# Patient Record
Sex: Male | Born: 1970 | Race: White | Hispanic: No | Marital: Married | Smoking: Former smoker
Health system: Southern US, Community
[De-identification: ages and names within clinical notes are randomized; demographics above are authoritative.]

## PROBLEM LIST (undated history)

## (undated) DIAGNOSIS — U071 COVID-19: Secondary | ICD-10-CM

## (undated) HISTORY — DX: COVID-19: U07.1

---

## 2016-04-05 DIAGNOSIS — Z87891 Personal history of nicotine dependence: Secondary | ICD-10-CM | POA: Insufficient documentation

## 2016-04-05 DIAGNOSIS — R0602 Shortness of breath: Secondary | ICD-10-CM | POA: Insufficient documentation

## 2016-04-05 DIAGNOSIS — F419 Anxiety disorder, unspecified: Secondary | ICD-10-CM | POA: Insufficient documentation

## 2019-04-03 ENCOUNTER — Emergency Department: Payer: 59

## 2019-04-03 ENCOUNTER — Emergency Department
Admission: EM | Admit: 2019-04-03 | Discharge: 2019-04-03 | Disposition: A | Discharge status: Home / Self Care | Payer: 59 | Attending: Student in an Organized Health Care Education/Training Program | Admitting: Student in an Organized Health Care Education/Training Program

## 2019-04-03 ENCOUNTER — Other Ambulatory Visit: Payer: Self-pay

## 2019-04-03 DIAGNOSIS — R079 Chest pain, unspecified: Secondary | ICD-10-CM | POA: Diagnosis present

## 2019-04-03 DIAGNOSIS — M7989 Other specified soft tissue disorders: Secondary | ICD-10-CM | POA: Insufficient documentation

## 2019-04-03 DIAGNOSIS — R0789 Other chest pain: Secondary | ICD-10-CM | POA: Insufficient documentation

## 2019-04-03 DIAGNOSIS — Z20828 Contact with and (suspected) exposure to other viral communicable diseases: Secondary | ICD-10-CM | POA: Insufficient documentation

## 2019-04-03 LAB — BASIC METABOLIC PANEL
Anion gap: 10 (ref 5–15)
BUN: 13 mg/dL (ref 6–20)
CO2: 25 mmol/L (ref 22–32)
Calcium: 9.4 mg/dL (ref 8.9–10.3)
Chloride: 104 mmol/L (ref 98–111)
Creatinine, Ser: 0.95 mg/dL (ref 0.61–1.24)
GFR calc Af Amer: >60 mL/min (ref >60)
GFR calc non Af Amer: >60 mL/min (ref >60)
Glucose, Bld: 90 mg/dL (ref 70–99)
Potassium: 3.9 mmol/L (ref 3.5–5.1)
Sodium: 139 mmol/L (ref 135–145)

## 2019-04-03 LAB — CBC
HCT: 42.8 % (ref 39.0–52.0)
Hemoglobin: 14.6 g/dL (ref 13.0–17.0)
MCH: 29.8 pg (ref 26.0–34.0)
MCHC: 34.1 g/dL (ref 30.0–36.0)
MCV: 87.3 fL (ref 80.0–100.0)
Platelets: 350 10*3/uL (ref 150–400)
RBC: 4.9 MIL/uL (ref 4.22–5.81)
RDW: 12.6 % (ref 11.5–15.5)
WBC: 8.6 10*3/uL (ref 4.0–10.5)
nRBC: 0 % (ref 0.0–0.2)

## 2019-04-03 LAB — SARS CORONAVIRUS 2 BY RT PCR (HOSPITAL ORDER, PERFORMED IN ~~LOC~~ HOSPITAL LAB): SARS Coronavirus 2: NEGATIVE

## 2019-04-03 LAB — TROPONIN I (HIGH SENSITIVITY)
Troponin I (High Sensitivity): <2 ng/L (ref <18)
Troponin I (High Sensitivity): <2 ng/L (ref <18)

## 2019-04-03 LAB — BRAIN NATRIURETIC PEPTIDE: B Natriuretic Peptide: 20 pg/mL (ref 0.0–100.0)

## 2019-04-03 MED ORDER — DOXYCYCLINE HYCLATE 100 MG PO TABS: 100.0000 mg | ORAL_TABLET | Freq: Two times a day (BID) | ORAL | 0 refills | Status: AC

## 2019-04-03 MED ORDER — HYDROCODONE-ACETAMINOPHEN 5-325 MG PO TABS
1.0000 | ORAL_TABLET | Freq: Once | ORAL | Status: AC
  Administered 2019-04-03: 1 via ORAL
  Filled 2019-04-03: qty 1

## 2019-04-03 MED ORDER — SODIUM CHLORIDE 0.9% FLUSH
3.0000 mL | Freq: Once | INTRAVENOUS | Status: AC
  Administered 2019-04-03: 18:00:00 3 mL via INTRAVENOUS

## 2019-04-03 NOTE — ED Provider Notes (Signed)
[facility] Emergency Department Provider Note    First MD Initiated Contact with Patient 04/03/19 1652     (approximate)  I have reviewed the triage vital signs and the nursing notes.   HISTORY  Chief Complaint Chest Pain    HPI Brett Warner is a 48 y.o. male who presents to the ER for evaluation of midsternal nonradiating chest pain.  States is also concerned about having some leg swelling for the past several months.  Has swelling in both legs.  Denies any pain when taking deep inspiration.   Denies any recent fevers.  Has been stressed out at work.  States he does have productive cough in the morning.  Denies any pain with deep inspiration or movement.  No recent trauma.  Does have family history of heart disease and congestive heart failure.   History reviewed. No pertinent past medical history. No family history on file. History reviewed. No pertinent surgical history. There are no active problems to display for this patient.     Prior to Admission medications   Medication Sig Start Date End Date Taking? Authorizing Provider  doxycycline (VIBRA-TABS) 100 MG tablet Take 1 tablet (100 mg total) by mouth 2 (two) times daily for 7 days. 04/03/19 04/10/19  Willy Eddy, MD    Allergies Patient has no allergy information on record.    Social History Social History   Tobacco Use  . Smoking status: Not on file  Substance Use Topics  . Alcohol use: Not on file  . Drug use: Not on file    Review of Systems Patient denies headaches, rhinorrhea, blurry vision, numbness, shortness of breath, chest pain, edema, cough, abdominal pain, nausea, vomiting, diarrhea, dysuria, fevers, rashes or hallucinations unless otherwise stated above in HPI. ____________________________________________   PHYSICAL EXAM:  VITAL SIGNS: Vitals:   04/03/19 1800 04/03/19 1830  BP: 136/82 124/78  Pulse: (!) 59 62  Resp: 14 17  Temp:    SpO2: 97% 98%     Constitutional: Alert and oriented.  Eyes: Conjunctivae are normal.  Head: Atraumatic. Nose: No congestion/rhinnorhea. Mouth/Throat: Mucous membranes are moist.   Neck: No stridor. Painless ROM.  Cardiovascular: Normal rate, regular rhythm. Grossly normal heart sounds.  Good peripheral circulation. Respiratory: Normal respiratory effort.  No retractions. Lungs CTAB. Gastrointestinal: Soft and nontender. No distention. No abdominal bruits. No CVA tenderness. Genitourinary:  Musculoskeletal: No lower extremity tenderness nor edema.  No joint effusions. Neurologic:  Normal speech and language. No gross focal neurologic deficits are appreciated. No facial droop Skin:  Skin is warm, dry and intact. No rash noted. Psychiatric: Mood and affect are normal. Speech and behavior are normal.  ____________________________________________   LABS (all labs ordered are listed, but only abnormal results are displayed)  Results for orders placed or performed during the hospital encounter of 04/03/19 (from the past 24 hour(s))  Basic metabolic panel     Status: None   Collection Time: 04/03/19  4:05 PM  Result Value Ref Range   Sodium 139 135 - 145 mmol/L   Potassium 3.9 3.5 - 5.1 mmol/L   Chloride 104 98 - 111 mmol/L   CO2 25 22 - 32 mmol/L   Glucose, Bld 90 70 - 99 mg/dL   BUN 13 6 - 20 mg/dL   Creatinine, Ser 7.10 0.61 - 1.24 mg/dL   Calcium 9.4 8.9 - 62.6 mg/dL   GFR calc non Af Amer >60 >60 mL/min   GFR calc Af Amer >60 >60 mL/min  Anion gap 10 5 - 15  CBC     Status: None   Collection Time: 04/03/19  4:05 PM  Result Value Ref Range   WBC 8.6 4.0 - 10.5 K/uL   RBC 4.90 4.22 - 5.81 MIL/uL   Hemoglobin 14.6 13.0 - 17.0 g/dL   HCT 16.142.8 09.639.0 - 04.552.0 %   MCV 87.3 80.0 - 100.0 fL   MCH 29.8 26.0 - 34.0 pg   MCHC 34.1 30.0 - 36.0 g/dL   RDW 40.912.6 81.111.5 - 91.415.5 %   Platelets 350 150 - 400 K/uL   nRBC 0.0 0.0 - 0.2 %  Troponin I (High Sensitivity)     Status: None   Collection Time: 04/03/19   4:05 PM  Result Value Ref Range   Troponin I (High Sensitivity) <2 <18 ng/L  Brain natriuretic peptide     Status: None   Collection Time: 04/03/19  4:05 PM  Result Value Ref Range   B Natriuretic Peptide 20.0 0.0 - 100.0 pg/mL  SARS Coronavirus 2 by RT PCR (hospital order, [facility]) Nasopharyngeal Nasopharyngeal Swab     Status: None   Collection Time: 04/03/19  5:19 PM   Specimen: Nasopharyngeal Swab  Result Value Ref Range   SARS Coronavirus 2 NEGATIVE NEGATIVE  Troponin I (High Sensitivity)     Status: None   Collection Time: 04/03/19  6:20 PM  Result Value Ref Range   Troponin I (High Sensitivity) <2 <18 ng/L   ____________________________________________  EKG My review and personal interpretation at Time: 15:47   Indication: chest pain  Rate: 75  Rhythm: sinus Axis: normal Other: normal intervals, no stemi ____________________________________________  RADIOLOGY  I personally reviewed all radiographic images ordered to evaluate for the above acute complaints and reviewed radiology reports and findings.  These findings were personally discussed with the patient.  Please see medical record for radiology report.  ____________________________________________   PROCEDURES  Procedure(s) performed:  Procedures    Critical Care performed: no ____________________________________________   INITIAL IMPRESSION / ASSESSMENT AND PLAN / ED COURSE  Pertinent labs & imaging results that were available during my care of the patient were reviewed by me and considered in my medical decision making (see chart for details).   DDX: ACS, pericarditis, esophagitis, boerhaaves, pe, dissection, pna, bronchitis, costochondritis   Brett Warner is a [age] y.o. who presents to the ED with symptoms as described above.  Patient well-appearing in no acute distress.  Afebrile hemodynamically stable.  EKG is nonischemic.  Initial troponin is negative.  He is low risk  by heart score.  Will order serial enzymes to further re-stratify.  He is low risk by Wells criteria and is PERC negative.  Not consistent with dissection.  Will provide pain medication.  His abdominal exam is soft and benign.  The patient will be placed on continuous pulse oximetry and telemetry for monitoring.  Laboratory evaluation will be sent to evaluate for the above complaints.     Clinical Course as of Apr 02 2010  Tue Apr 03, 2019  2006 Patient reassessed.  Currently pain-free.  Work-up in the ER has been grossly reassuring.  Will cover with antibiotics for possible pneumonia.  No sepsis.  His code is negative.  This point believe he stable and appropriate for outpatient follow-up.   [PR]    Clinical Course User Index [PR] Willy Eddyobinson, Heloise Gordan, MD    The patient was evaluated in Emergency Department today for the symptoms described in the  history of present illness. He/she was evaluated in the context of the global COVID-19 pandemic, which necessitated consideration that the patient might be at risk for infection with the SARS-CoV-2 virus that causes COVID-19. Institutional protocols and algorithms that pertain to the evaluation of patients at risk for COVID-19 are in a state of rapid change based on information released by regulatory bodies including the CDC and federal and state organizations. These policies and algorithms were followed during the patient's care in the ED.  As part of my medical decision making, I reviewed the following data within the [facility] notes reviewed and incorporated, Labs reviewed, notes from prior ED visits and [place] Controlled Substance Database   ____________________________________________   FINAL CLINICAL IMPRESSION(S) / ED DIAGNOSES  Final diagnoses:  Atypical chest pain      NEW MEDICATIONS STARTED DURING THIS VISIT:  New Prescriptions   DOXYCYCLINE (VIBRA-TABS) 100 MG TABLET    Take 1 tablet (100 mg total) by mouth 2  (two) times daily for 7 days.     Note:  This document was prepared using Dragon voice recognition software and may include unintentional dictation errors.    Merlyn Lot, MD 04/03/19 2011

## 2019-04-03 NOTE — ED Triage Notes (Signed)
Pt comes via POV from home with c/o chest pain and weakness. Pt states the chest pain started around lunch. Pt states tightness in mid sternal.  Pt states some radiation to right arm.  Pt states bilateral leg swelling. Pt states he used to take a lasix pill, but doesn't anymore.  Pt also states that he has been fatigued for last couple of days. Pt states SOB. Pt denies any cough. Pt also states decrease in appetite.

## 2019-04-03 NOTE — ED Notes (Signed)
Patient states he was prescribed lasix by Dr. Arline Asp in [place] for bilateral leg swelling and he took them as prescribed. States he ran out 2 weeks ago and has not been back to have prescription refilled. Patient reports worsening leg swelling for the last 2 weeks as well as mild shortness of breath and chest pain.

## 2020-02-05 ENCOUNTER — Other Ambulatory Visit: Payer: Self-pay

## 2020-02-05 ENCOUNTER — Emergency Department: Payer: 59

## 2020-02-05 ENCOUNTER — Encounter: Payer: Self-pay | Admitting: Emergency Medicine

## 2020-02-05 DIAGNOSIS — I10 Essential (primary) hypertension: Secondary | ICD-10-CM | POA: Diagnosis present

## 2020-02-05 DIAGNOSIS — Z8249 Family history of ischemic heart disease and other diseases of the circulatory system: Secondary | ICD-10-CM

## 2020-02-05 DIAGNOSIS — J1282 Pneumonia due to coronavirus disease 2019: Secondary | ICD-10-CM | POA: Diagnosis present

## 2020-02-05 DIAGNOSIS — U071 COVID-19: Secondary | ICD-10-CM | POA: Diagnosis not present

## 2020-02-05 DIAGNOSIS — E876 Hypokalemia: Secondary | ICD-10-CM | POA: Diagnosis not present

## 2020-02-05 DIAGNOSIS — Z87891 Personal history of nicotine dependence: Secondary | ICD-10-CM

## 2020-02-05 DIAGNOSIS — J9601 Acute respiratory failure with hypoxia: Secondary | ICD-10-CM | POA: Diagnosis present

## 2020-02-05 DIAGNOSIS — F419 Anxiety disorder, unspecified: Secondary | ICD-10-CM | POA: Diagnosis present

## 2020-02-05 DIAGNOSIS — E871 Hypo-osmolality and hyponatremia: Secondary | ICD-10-CM | POA: Diagnosis present

## 2020-02-05 DIAGNOSIS — Z79899 Other long term (current) drug therapy: Secondary | ICD-10-CM

## 2020-02-05 DIAGNOSIS — Z7952 Long term (current) use of systemic steroids: Secondary | ICD-10-CM

## 2020-02-05 LAB — CBC
HCT: 44.4 % (ref 39.0–52.0)
Hemoglobin: 16 g/dL (ref 13.0–17.0)
MCH: 29.9 pg (ref 26.0–34.0)
MCHC: 36 g/dL (ref 30.0–36.0)
MCV: 83 fL (ref 80.0–100.0)
Platelets: 246 10*3/uL (ref 150–400)
RBC: 5.35 MIL/uL (ref 4.22–5.81)
RDW: 12.6 % (ref 11.5–15.5)
WBC: 6.1 10*3/uL (ref 4.0–10.5)
nRBC: 0 % (ref 0.0–0.2)

## 2020-02-05 LAB — BASIC METABOLIC PANEL
Anion gap: 12 (ref 5–15)
BUN: 16 mg/dL (ref 6–20)
CO2: 22 mmol/L (ref 22–32)
Calcium: 8.5 mg/dL — ABNORMAL LOW (ref 8.9–10.3)
Chloride: 99 mmol/L (ref 98–111)
Creatinine, Ser: 0.86 mg/dL (ref 0.61–1.24)
GFR calc Af Amer: >60 mL/min (ref >60)
GFR calc non Af Amer: >60 mL/min (ref >60)
Glucose, Bld: 118 mg/dL — ABNORMAL HIGH (ref 70–99)
Potassium: 3.6 mmol/L (ref 3.5–5.1)
Sodium: 133 mmol/L — ABNORMAL LOW (ref 135–145)

## 2020-02-05 LAB — TROPONIN I (HIGH SENSITIVITY): Troponin I (High Sensitivity): 5 ng/L (ref <18)

## 2020-02-05 NOTE — ED Triage Notes (Signed)
Pt presents to ED with worsening SOB and chest pain. Dx with COVID on Friday. EMS put pt on O2 due to low O2 saturation. Currently 93% on 4L. Pt states he has chronic sinus congestion but never felt like this.

## 2020-02-06 ENCOUNTER — Inpatient Hospital Stay
Admission: EM | Admit: 2020-02-06 | Discharge: 2020-02-10 | DRG: 177 | Disposition: A | Discharge status: Home / Self Care | Payer: 59 | Attending: Family Medicine | Admitting: Family Medicine

## 2020-02-06 ENCOUNTER — Encounter: Payer: Self-pay | Admitting: Family Medicine

## 2020-02-06 ENCOUNTER — Other Ambulatory Visit: Payer: Self-pay

## 2020-02-06 DIAGNOSIS — E871 Hypo-osmolality and hyponatremia: Secondary | ICD-10-CM | POA: Diagnosis present

## 2020-02-06 DIAGNOSIS — J1282 Pneumonia due to coronavirus disease 2019: Secondary | ICD-10-CM | POA: Diagnosis present

## 2020-02-06 DIAGNOSIS — I10 Essential (primary) hypertension: Secondary | ICD-10-CM

## 2020-02-06 DIAGNOSIS — J9601 Acute respiratory failure with hypoxia: Secondary | ICD-10-CM | POA: Diagnosis present

## 2020-02-06 DIAGNOSIS — F419 Anxiety disorder, unspecified: Secondary | ICD-10-CM | POA: Diagnosis present

## 2020-02-06 DIAGNOSIS — Z87891 Personal history of nicotine dependence: Secondary | ICD-10-CM | POA: Diagnosis not present

## 2020-02-06 DIAGNOSIS — Z7952 Long term (current) use of systemic steroids: Secondary | ICD-10-CM | POA: Diagnosis not present

## 2020-02-06 DIAGNOSIS — Z79899 Other long term (current) drug therapy: Secondary | ICD-10-CM | POA: Diagnosis not present

## 2020-02-06 DIAGNOSIS — U071 COVID-19: Secondary | ICD-10-CM | POA: Diagnosis present

## 2020-02-06 DIAGNOSIS — R0902 Hypoxemia: Secondary | ICD-10-CM | POA: Diagnosis not present

## 2020-02-06 DIAGNOSIS — Z8249 Family history of ischemic heart disease and other diseases of the circulatory system: Secondary | ICD-10-CM | POA: Diagnosis not present

## 2020-02-06 DIAGNOSIS — E876 Hypokalemia: Secondary | ICD-10-CM | POA: Diagnosis not present

## 2020-02-06 LAB — TROPONIN I (HIGH SENSITIVITY): Troponin I (High Sensitivity): 5 ng/L (ref <18)

## 2020-02-06 LAB — HIV ANTIBODY (ROUTINE TESTING W REFLEX): HIV Screen 4th Generation wRfx: NONREACTIVE

## 2020-02-06 LAB — FIBRINOGEN: Fibrinogen: 657 mg/dL — ABNORMAL HIGH (ref 210–475)

## 2020-02-06 LAB — BRAIN NATRIURETIC PEPTIDE: B Natriuretic Peptide: 34 pg/mL (ref 0.0–100.0)

## 2020-02-06 LAB — HEPATITIS B SURFACE ANTIGEN: Hepatitis B Surface Ag: NONREACTIVE

## 2020-02-06 LAB — LACTATE DEHYDROGENASE: LDH: 318 U/L — ABNORMAL HIGH (ref 98–192)

## 2020-02-06 LAB — C-REACTIVE PROTEIN: CRP: 2.9 mg/dL — ABNORMAL HIGH (ref <1.0)

## 2020-02-06 LAB — GLUCOSE, CAPILLARY: Glucose-Capillary: 121 mg/dL — ABNORMAL HIGH (ref 70–99)

## 2020-02-06 LAB — FERRITIN: Ferritin: 1711 ng/mL — ABNORMAL HIGH (ref 24–336)

## 2020-02-06 LAB — FIBRIN DERIVATIVES D-DIMER (ARMC ONLY): Fibrin derivatives D-dimer (ARMC): 1165.57 ng/mL (FEU) — ABNORMAL HIGH (ref 0.00–499.00)

## 2020-02-06 LAB — PROCALCITONIN: Procalcitonin: <0.1 ng/mL

## 2020-02-06 LAB — SARS CORONAVIRUS 2 BY RT PCR (HOSPITAL ORDER, PERFORMED IN ~~LOC~~ HOSPITAL LAB): SARS Coronavirus 2: POSITIVE — AB

## 2020-02-06 MED ORDER — ASCORBIC ACID 500 MG PO TABS
1000.0000 mg | ORAL_TABLET | Freq: Every day | ORAL | Status: DC
  Administered 2020-02-06 – 2020-02-10 (×5): 1000 mg via ORAL
  Filled 2020-02-06 (×5): qty 2

## 2020-02-06 MED ORDER — DEXAMETHASONE SODIUM PHOSPHATE 10 MG/ML IJ SOLN
6.0000 mg | INTRAMUSCULAR | Status: DC
  Administered 2020-02-06 – 2020-02-10 (×5): 6 mg via INTRAVENOUS
  Filled 2020-02-06 (×4): qty 1

## 2020-02-06 MED ORDER — ENOXAPARIN SODIUM 40 MG/0.4ML ~~LOC~~ SOLN
40.0000 mg | SUBCUTANEOUS | Status: DC
  Administered 2020-02-06 – 2020-02-10 (×5): 40 mg via SUBCUTANEOUS
  Filled 2020-02-06 (×5): qty 0.4

## 2020-02-06 MED ORDER — MAGNESIUM HYDROXIDE 400 MG/5ML PO SUSP
30.0000 mL | Freq: Every day | ORAL | Status: DC | PRN
  Filled 2020-02-06: qty 30

## 2020-02-06 MED ORDER — ASPIRIN EC 81 MG PO TBEC
81.0000 mg | DELAYED_RELEASE_TABLET | Freq: Every day | ORAL | Status: DC
  Administered 2020-02-06 – 2020-02-10 (×5): 81 mg via ORAL
  Filled 2020-02-06 (×5): qty 1

## 2020-02-06 MED ORDER — TRAZODONE HCL 50 MG PO TABS
25.0000 mg | ORAL_TABLET | Freq: Every evening | ORAL | Status: DC | PRN
  Filled 2020-02-06: qty 1

## 2020-02-06 MED ORDER — BARICITINIB 2 MG PO TABS
4.0000 mg | ORAL_TABLET | Freq: Every day | ORAL | Status: DC
  Administered 2020-02-06 – 2020-02-10 (×4): 4 mg via ORAL
  Filled 2020-02-06 (×7): qty 2

## 2020-02-06 MED ORDER — SODIUM CHLORIDE 0.9 % IV SOLN: 200.0000 mg | Freq: Once | INTRAVENOUS | Status: DC

## 2020-02-06 MED ORDER — SODIUM CHLORIDE 0.9 % IV SOLN
100.0000 mg | Freq: Every day | INTRAVENOUS | Status: AC
  Administered 2020-02-07 – 2020-02-10 (×4): 100 mg via INTRAVENOUS
  Filled 2020-02-06 (×4): qty 20

## 2020-02-06 MED ORDER — GUAIFENESIN ER 600 MG PO TB12
600.0000 mg | ORAL_TABLET | Freq: Two times a day (BID) | ORAL | Status: DC
  Administered 2020-02-06 – 2020-02-10 (×9): 600 mg via ORAL
  Filled 2020-02-06 (×10): qty 1

## 2020-02-06 MED ORDER — FAMOTIDINE 20 MG PO TABS
20.0000 mg | ORAL_TABLET | Freq: Two times a day (BID) | ORAL | Status: DC
  Administered 2020-02-06 – 2020-02-10 (×9): 20 mg via ORAL
  Filled 2020-02-06 (×9): qty 1

## 2020-02-06 MED ORDER — DEXAMETHASONE SODIUM PHOSPHATE 10 MG/ML IJ SOLN: 6.0000 mg | INTRAMUSCULAR | Status: DC

## 2020-02-06 MED ORDER — SODIUM CHLORIDE 0.9 % IV SOLN: 100.0000 mg | Freq: Every day | INTRAVENOUS | Status: DC

## 2020-02-06 MED ORDER — GUAIFENESIN-DM 100-10 MG/5ML PO SYRP
10.0000 mL | ORAL_SOLUTION | ORAL | Status: DC | PRN
  Filled 2020-02-06: qty 10

## 2020-02-06 MED ORDER — PROPRANOLOL HCL ER 80 MG PO CP24
80.0000 mg | ORAL_CAPSULE | Freq: Every day | ORAL | Status: DC
  Administered 2020-02-06 – 2020-02-10 (×5): 80 mg via ORAL
  Filled 2020-02-06 (×5): qty 1

## 2020-02-06 MED ORDER — SODIUM CHLORIDE 0.9 % IV SOLN: INTRAVENOUS | Status: DC

## 2020-02-06 MED ORDER — FUROSEMIDE 20 MG PO TABS
20.0000 mg | ORAL_TABLET | Freq: Every day | ORAL | Status: DC
  Administered 2020-02-06 – 2020-02-08 (×3): 20 mg via ORAL
  Filled 2020-02-06 (×3): qty 1

## 2020-02-06 MED ORDER — ONDANSETRON HCL 4 MG/2ML IJ SOLN: 4.0000 mg | Freq: Four times a day (QID) | INTRAMUSCULAR | Status: DC | PRN

## 2020-02-06 MED ORDER — ONDANSETRON HCL 4 MG PO TABS: 4.0000 mg | ORAL_TABLET | Freq: Four times a day (QID) | ORAL | Status: DC | PRN

## 2020-02-06 MED ORDER — ZINC SULFATE 220 (50 ZN) MG PO CAPS
220.0000 mg | ORAL_CAPSULE | Freq: Every day | ORAL | Status: DC
  Administered 2020-02-06 – 2020-02-10 (×5): 220 mg via ORAL
  Filled 2020-02-06 (×5): qty 1

## 2020-02-06 MED ORDER — VITAMIN D 25 MCG (1000 UNIT) PO TABS
1000.0000 [IU] | ORAL_TABLET | Freq: Every day | ORAL | Status: DC
  Administered 2020-02-06 – 2020-02-10 (×5): 1000 [IU] via ORAL
  Filled 2020-02-06 (×5): qty 1

## 2020-02-06 MED ORDER — SODIUM CHLORIDE 0.9 % IV SOLN
200.0000 mg | Freq: Once | INTRAVENOUS | Status: AC
  Administered 2020-02-06: 200 mg via INTRAVENOUS
  Filled 2020-02-06: qty 40

## 2020-02-06 MED ORDER — ACETAMINOPHEN 325 MG PO TABS: 650.0000 mg | ORAL_TABLET | Freq: Four times a day (QID) | ORAL | Status: DC | PRN

## 2020-02-06 MED ORDER — HYDROCOD POLST-CPM POLST ER 10-8 MG/5ML PO SUER: 5.0000 mL | Freq: Two times a day (BID) | ORAL | Status: DC | PRN

## 2020-02-06 NOTE — ED Notes (Signed)
Recollect of pink top sent at this time. Pt remains resting in NAD.

## 2020-02-06 NOTE — Progress Notes (Signed)
Remdesivir - Pharmacy Brief Note     A/P:  Remdesivir 200 mg IVPB once followed by 100 mg IVPB daily x 4 days.   Valrie Hart, PharmD Clinical Pharmacist   02/06/2020 3:57 AM

## 2020-02-06 NOTE — H&P (Addendum)
[place]   PATIENT NAME: Brett Warner    MR#:  [id number]  DATE OF BIRTH:  1970-06-29  DATE OF ADMISSION:  02/06/2020  PRIMARY CARE PHYSICIAN: Practice, [facility]   REQUESTING/REFERRING PHYSICIAN: Chiquita Loth, MD CHIEF COMPLAINT:   Chief Complaint  Patient presents with  . Shortness of Breath  . Chest Pain    HISTORY OF PRESENT ILLNESS:  Brett Warner  is a 49 y.o. male with a known history of hypertension, presented to the emergency room with acute onset of cough with associated dyspnea and wheezing and chest pain only with cough Tuesday last week.  The patient tested positive for COVID-19 on Friday.  His symptoms have been worsening.  When he was seen by EMS he was placed on 4 L O2 by nasal cannula that improved his pulse oximetry to 93%.  He admitted to associated myalgia and low-grade fever with a T-max of 99.8 here.  No nausea or vomiting but he had a touch of diarrhea without abdominal pain.  He denies any loss of taste or smell.  He has not been vaccinated for COVID-19 as he does not believe in it.  He has been exposed to his wife also tested positive but has not been hospitalized.  Upon presentation to the emergency room, heart rate was 123 with temperature of 99.5 approximately was 94% on 4 L of O2 by nasal cannula and later heart rate was 121 with respiratory to 22.  Labs reviewed mild hyponatremia.  High-sensitivity troponin I was 5 and later the same.  COVID-19 PCR came back positive.  Two-view chest ray showed diffuse increased interstitial prominence with patchy left basilar airspace disease consistent with bronchopneumonia. EKG showed sinus tachycardia with rate 124 with minimal voltage criteria for LVH.  The patient was given IV remdesivir and Decadron.  He will be admitted to a medical monitored isolation bed for further evaluation and management.  PAST MEDICAL HISTORY:  Hypertension tension  PAST SURGICAL HISTORY:  He denies any previous  surgeries SOCIAL HISTORY:   Social History   Tobacco Use  . Smoking status: Former Games developer  . Smokeless tobacco: Never Used  Substance Use Topics  . Alcohol use: Not Currently    FAMILY HISTORY:   Positive hypertension in his father.  DRUG ALLERGIES:  No Known Allergies  REVIEW OF SYSTEMS:   ROS As per history of present illness. All pertinent systems were reviewed above. Constitutional, HEENT, cardiovascular, respiratory, GI, GU, musculoskeletal, neuro, psychiatric, endocrine, integumentary and hematologic systems were reviewed and are otherwise negative/unremarkable except for positive findings mentioned above in the HPI.   MEDICATIONS AT HOME:   Prior to Admission medications   Medication Sig Start Date End Date Taking? Authorizing Provider  furosemide (LASIX) 20 MG tablet Take 20 mg by mouth daily. 12/28/19  Yes [provider]  predniSONE (DELTASONE) 20 MG tablet Take 60 mg by mouth daily. 02/01/20  Yes [provider]  propranolol ER (INDERAL LA) 80 MG 24 hr capsule Take 80 mg by mouth daily. 11/16/19  Yes [provider]      VITAL SIGNS:  Blood pressure (!) 137/96, pulse (!) 103, temperature 99.8 F (37.7 C), resp. rate (!) 22, height 6\' 1"  (1.854 m), weight 114.8 kg, SpO2 95 %.  PHYSICAL EXAMINATION:  Physical Exam  GENERAL:  49 y.o.-year-old patient lying in the bed with mild conversational dyspnea. EYES: Pupils equal, round, reactive to light and accommodation. No scleral icterus. Extraocular muscles intact.  HEENT: Head atraumatic, normocephalic. Oropharynx and nasopharynx clear.  NECK:  Supple, no jugular venous distention. No thyroid enlargement, no tenderness.  LUNGS: Diminished bibasal breath sounds with bibasal crackles. CARDIOVASCULAR: Regular rate and rhythm, S1, S2 normal. No murmurs, rubs, or gallops.  ABDOMEN: Soft, nondistended, nontender. Bowel sounds present. No organomegaly or mass.  EXTREMITIES: No pedal edema, cyanosis,  or clubbing.  NEUROLOGIC: Cranial nerves II through XII are intact. Muscle strength 5/5 in all extremities. Sensation intact. Gait not checked.  PSYCHIATRIC: The patient is alert and oriented x 3.  Normal affect and good eye contact. SKIN: No obvious rash, lesion, or ulcer.   LABORATORY PANEL:   CBC Recent Labs  Lab 02/05/20 2014  WBC 6.1  HGB 16.0  HCT 44.4  PLT 246   ------------------------------------------------------------------------------------------------------------------  Chemistries  Recent Labs  Lab 02/05/20 2014  NA 133*  K 3.6  CL 99  CO2 22  GLUCOSE 118*  BUN 16  CREATININE 0.86  CALCIUM 8.5*   ------------------------------------------------------------------------------------------------------------------  Cardiac Enzymes No results for input(s): TROPONINI in the last 168 hours. ------------------------------------------------------------------------------------------------------------------  RADIOLOGY:  DG Chest 2 View  Result Date: 02/05/2020 CLINICAL DATA:  Short of breath, chest pain, COVID-19 positive 5 days ago EXAM: CHEST - 2 VIEW COMPARISON:  04/03/2019 FINDINGS: Frontal and lateral views of the chest demonstrate a stable cardiac silhouette. There is increased interstitial prominence since prior study, with patchy consolidation at the left lateral lung base. No effusion or pneumothorax. No acute bony abnormalities. IMPRESSION: 1. Diffuse increased interstitial prominence, with patchy left basilar airspace disease. Findings consistent with bronchopneumonia. Electronically Signed   By: Sharlet Salina M.D.   On: 02/05/2020 20:31      IMPRESSION AND PLAN:   1.  Acute hypoxemic respiratory failure secondary to COVID-19. -The patient will be admitted to a medically monitored isolation bed. -O2 protocol will be followed to keep O2 saturation above 93.   2.  Multifocal pneumonia secondary to COVID-19. -The patient will be admitted to an isolation  monitored bed with droplet and contact precautions. -Given multifocal pneumonia we will empirically place the patient on IV Rocephin and Zithromax for possible bacterial superinfection only with elevated Procalcitonin. -The patient will be placed on scheduled Mucinex and as needed Tussionex. -We will avoid nebulization as much as we can, give bronchodilator MDI if needed, and with deterioration of oxygenation try to avoid BiPAP/CPAP if possible.    -Will obtain sputum Gram stain culture and sensitivity and follow blood cultures. -O2 protocol will be followed. -We will follow CRP, ferritin, LDH and D-dimer. -Will follow manual differential for ANC/ALC ratio as well as follow troponin I and daily CBC with manual differential and CMP. - Will place the patient on IV Remdesivir and IV steroid therapy with Decadron with elevated inflammatory markers. -I discussed Baricitinib with the patient and he agreed to proceed with it. -The patient will be placed on vitamin D3, vitamin C, zinc sulfate, p.o. Pepcid and aspirin. -Actemra can be considered for CRP more than 7 with associated hypoxemia.  3.  Mild hyponatremia. -The patient will be hydrated with IV normal saline. -We will follow BMPs.  4.  Hypertension -We will continue Inderal LA  5.  DVT prophylaxis. -Subcutaneous Lovenox    All the records are reviewed and case discussed with ED provider. The plan of care was discussed in details with the patient (and family). I answered all questions. The patient agreed to proceed with the above mentioned plan. Further management will depend upon hospital  course.   CODE STATUS: Full code  Status is: Inpatient  Remains inpatient appropriate because:Ongoing diagnostic testing needed not appropriate for outpatient work up, Unsafe d/c plan, IV treatments appropriate due to intensity of illness or inability to take PO and Inpatient level of care appropriate due to severity of illness   Dispo: The  patient is from: Home              Anticipated d/c is to: Home              Anticipated d/c date is: 3 days              Patient currently is not medically stable to d/c.    TOTAL TIME TAKING CARE OF THIS PATIENT: 55 minutes.    Hannah Beat M.D on 02/06/2020 at 6:51 AM  [facility]   From 7 PM-7 AM, contact night-coverage [email or web address]  CC: Primary care physician; Practice, [facility]   Note: This dictation was prepared with Dragon dictation along with smaller phrase technology. Any transcriptional typo errors that result from this process are unintentional.

## 2020-02-06 NOTE — ED Notes (Signed)
Pt resting in NAD. Denies any needs at this time.  

## 2020-02-06 NOTE — ED Notes (Signed)
Pt given breakfast tray. Does not want it. Sat at bedside.

## 2020-02-06 NOTE — Progress Notes (Signed)
Report called to victoria on 1C

## 2020-02-06 NOTE — Progress Notes (Addendum)
Subjective: Patient admitted this morning, see detailed H&P by Dr Arville Care 49 year old male with a history of hypertension presents with dyspnea and wheezing.  Was found to have COVID-19 positive on Friday.  Patient is requiring 4 L/min of oxygen via nasal cannula.  Heart rate 121.  Respiration 22/min.  Chest x-ray showed diffuse interstitial prominence with patchy left basilar airspace consistent with bronchopneumonia.  COVID-19 Labs  Recent Labs    02/06/20 0607  FERRITIN 1,711*  LDH 318*  CRP 2.9*    Lab Results  Component Value Date   SARSCOV2NAA POSITIVE (A) 02/06/2020   SARSCOV2NAA NEGATIVE 04/03/2019    Vitals:   02/06/20 1200 02/06/20 1424  BP: 121/86 (!) 129/92  Pulse:  72  Resp: 16 20  Temp:  97.8 F (36.6 C)  SpO2: 93% 94%      A/P  Multifocal pneumonia due to COVID-19 infection Patient started on remdesivir, Decadron, baricitinib. Also started on Rocephin and Zithromax for superimposed bacterial infection Continue scheduled Mucinex Bronchodilator MDI Vitamin D3, vitamin C, zinc, p.o. Pepcid, aspirin Change IV fluids to KVO to avoid fluid overload.   Meredeth Ide [facility] [phone]

## 2020-02-06 NOTE — ED Notes (Signed)
Admitting MD at bedside.

## 2020-02-06 NOTE — ED Notes (Signed)
Pt remains resting. Pt takes one pill at a time with water. Given pillow.

## 2020-02-06 NOTE — ED Notes (Signed)
Pt resting in NAD. Wakes easily. Introduced self to pt. Pt head of bed adjusted. Pt denies further needs. Lights turned off for comfort.

## 2020-02-06 NOTE — ED Provider Notes (Signed)
[facility] Emergency Department Provider Note   ____________________________________________   First MD Initiated Contact with Patient 02/06/20 [phone]     (approximate)  I have reviewed the triage vital signs and the nursing notes.   HISTORY  Chief Complaint Shortness of Breath and Chest Pain    HPI Brett Warner is a 50 y.o. male brought to the ED via EMS from home with a chief complaint of cough, shortness of breath and chest pain.  Patient with diagnosis with COVID-19 last Friday.  Complains of worsening symptoms.  EMS placed patient on 4 L nasal cannula oxygen with saturations 93%.  Patient complains of myalgias, chest pain, nonproductive cough, shortness of breath.  Denies abdominal pain, nausea, vomiting or diarrhea.  Patient is unvaccinated against COVID-19.      Past medical history Anxiety  Patient Active Problem List   Diagnosis Date Noted  . Pneumonia due to COVID-19 virus 02/06/2020    Past surgical history Eardrum patch  Prior to Admission medications   Not on File    Allergies Patient has no known allergies.  History reviewed. No pertinent family history.  Social History Social History   Tobacco Use  . Smoking status: Former Games developer  . Smokeless tobacco: Never Used  Vaping Use  . Vaping Use: Never used  Substance Use Topics  . Alcohol use: Not Currently  . Drug use: Not Currently    Review of Systems  Constitutional: Positive for generalized malaise.  No fever/chills Eyes: No visual changes. ENT: No sore throat. Cardiovascular: Positive for chest pain. Respiratory: Positive for cough and shortness of breath. Gastrointestinal: No abdominal pain.  No nausea, no vomiting.  No diarrhea.  No constipation. Genitourinary: Negative for dysuria. Musculoskeletal: Negative for back pain. Skin: Negative for rash. Neurological: Negative for headaches, focal weakness or  numbness.   ____________________________________________   PHYSICAL EXAM:  VITAL SIGNS: ED Triage Vitals  Enc Vitals Group     BP 02/05/20 1957 120/77     Pulse Rate 02/05/20 1957 (!) 123     Resp 02/05/20 1957 20     Temp 02/05/20 1957 99.5 F (37.5 C)     Temp Source 02/05/20 1957 Oral     SpO2 02/05/20 1957 94 %     Weight 02/05/20 1958 253 lb (114.8 kg)     Height 02/05/20 1958 6\' 1"  (1.854 m)     Head Circumference --      Peak Flow --      Pain Score 02/05/20 2009 2     Pain Loc --      Pain Edu? --      Excl. in GC? --     Constitutional: Alert and oriented.  Ill appearing and in mild acute distress. Eyes: Conjunctivae are normal. PERRL. EOMI. Head: Atraumatic. Nose: Congestion/rhinnorhea. Mouth/Throat: Mucous membranes are mildly dry.   Neck: No stridor.   Cardiovascular: Normal rate, regular rhythm. Grossly normal heart sounds.  Good peripheral circulation. Respiratory: Increased respiratory effort.  No retractions. Lungs with scattered rhonchi. Gastrointestinal: Soft and nontender. No distention. No abdominal bruits. No CVA tenderness. Musculoskeletal: No lower extremity tenderness nor edema.  No joint effusions. Neurologic:  Normal speech and language. No gross focal neurologic deficits are appreciated.  Skin:  Skin is warm, dry and intact. No rash noted.  No petechiae. Psychiatric: Mood and affect are normal. Speech and behavior are normal.  ____________________________________________   LABS (all labs ordered are listed, but only abnormal results are displayed)  Labs  Reviewed  SARS CORONAVIRUS 2 BY RT PCR (HOSPITAL ORDER, PERFORMED IN [facility]) - Abnormal; Notable for the following components:      Result Value   SARS Coronavirus 2 POSITIVE (*)    All other components within normal limits  BASIC METABOLIC PANEL - Abnormal; Notable for the following components:   Sodium 133 (*)    Glucose, Bld 118 (*)    Calcium 8.5 (*)    All other  components within normal limits  CBC  HIV ANTIBODY (ROUTINE TESTING W REFLEX)  BRAIN NATRIURETIC PEPTIDE  C-REACTIVE PROTEIN  FIBRIN DERIVATIVES D-DIMER ([facility] ONLY)  FERRITIN  FIBRINOGEN  HEPATITIS B SURFACE ANTIGEN  LACTATE DEHYDROGENASE  PROCALCITONIN  ABO/RH  TROPONIN I (HIGH SENSITIVITY)  TROPONIN I (HIGH SENSITIVITY)   ____________________________________________  EKG  ED ECG REPORT I, Wali Reinheimer J, the attending physician, personally viewed and interpreted this ECG.   Date: 02/06/2020  EKG Time: 2004  Rate: 124  Rhythm: sinus tachycardia  Axis: Normal  Intervals:none  ST&T Change: Nonspecific  ____________________________________________  RADIOLOGY  ED MD interpretation: Chest x-ray consistent with bronchopneumonia  Official radiology report(s): DG Chest 2 View  Result Date: 02/05/2020 CLINICAL DATA:  Short of breath, chest pain, COVID-19 positive 5 days ago EXAM: CHEST - 2 VIEW COMPARISON:  04/03/2019 FINDINGS: Frontal and lateral views of the chest demonstrate a stable cardiac silhouette. There is increased interstitial prominence since prior study, with patchy consolidation at the left lateral lung base. No effusion or pneumothorax. No acute bony abnormalities. IMPRESSION: 1. Diffuse increased interstitial prominence, with patchy left basilar airspace disease. Findings consistent with bronchopneumonia. Electronically Signed   By: Sharlet Salina M.D.   On: 02/05/2020 20:31    ____________________________________________   PROCEDURES  Procedure(s) performed (including Critical Care):  Procedures   ____________________________________________   INITIAL IMPRESSION / ASSESSMENT AND PLAN / ED COURSE  As part of my medical decision making, I reviewed the following data within the electronic MEDICAL RECORD NUMBER Nursing notes reviewed and incorporated, Labs reviewed, EKG interpreted, Old chart reviewed, Radiograph reviewed, Discussed with admitting physician and  Notes from prior ED visits     Brett Warner was evaluated in Emergency Department on 02/06/2020 for the symptoms described in the history of present illness. He was evaluated in the context of the global COVID-19 pandemic, which necessitated consideration that the patient might be at risk for infection with the SARS-CoV-2 virus that causes COVID-19. Institutional protocols and algorithms that pertain to the evaluation of patients at risk for COVID-19 are in a state of rapid change based on information released by regulatory bodies including the CDC and federal and state organizations. These policies and algorithms were followed during the patient's care in the ED.    49 year old male presenting with fever, cough and shortness of breath; tested positive for COVID-19 several days ago. Differential includes, but is not limited to, viral syndrome, bronchitis including COPD exacerbation, pneumonia, reactive airway disease including asthma, CHF including exacerbation with or without pulmonary/interstitial edema, pneumothorax, ACS, thoracic trauma, and pulmonary embolism.  Laboratory results notable for mild hyponatremia, chest x-ray consistent with bronchopneumonia.  Will initiate IV Decadron and Remdesivir.  Will discuss with hospitalist services for admission given patient's new oxygen requirement.      ____________________________________________   FINAL CLINICAL IMPRESSION(S) / ED DIAGNOSES  Final diagnoses:  Pneumonia due to COVID-19 virus  Hypoxia  Hyponatremia     ED Discharge Orders    None       Note:  This document was prepared using Dragon voice recognition software and may include unintentional dictation errors.   Irean Hong, MD 02/06/20 [phone]

## 2020-02-07 DIAGNOSIS — R0902 Hypoxemia: Secondary | ICD-10-CM

## 2020-02-07 LAB — CBC WITH DIFFERENTIAL/PLATELET
Abs Immature Granulocytes: 0.02 10*3/uL (ref 0.00–0.07)
Basophils Absolute: 0 10*3/uL (ref 0.0–0.1)
Basophils Relative: 0 %
Eosinophils Absolute: 0 10*3/uL (ref 0.0–0.5)
Eosinophils Relative: 0 %
HCT: 41.2 % (ref 39.0–52.0)
Hemoglobin: 14.2 g/dL (ref 13.0–17.0)
Immature Granulocytes: 0 %
Lymphocytes Relative: 19 %
Lymphs Abs: 1 10*3/uL (ref 0.7–4.0)
MCH: 30 pg (ref 26.0–34.0)
MCHC: 34.5 g/dL (ref 30.0–36.0)
MCV: 87.1 fL (ref 80.0–100.0)
Monocytes Absolute: 0.5 10*3/uL (ref 0.1–1.0)
Monocytes Relative: 10 %
Neutro Abs: 3.6 10*3/uL (ref 1.7–7.7)
Neutrophils Relative %: 71 %
Platelets: 261 10*3/uL (ref 150–400)
RBC: 4.73 MIL/uL (ref 4.22–5.81)
RDW: 12.5 % (ref 11.5–15.5)
WBC: 5.1 10*3/uL (ref 4.0–10.5)
nRBC: 0 % (ref 0.0–0.2)

## 2020-02-07 LAB — COMPREHENSIVE METABOLIC PANEL
ALT: 43 U/L (ref 0–44)
AST: 44 U/L — ABNORMAL HIGH (ref 15–41)
Albumin: 3.1 g/dL — ABNORMAL LOW (ref 3.5–5.0)
Alkaline Phosphatase: 48 U/L (ref 38–126)
Anion gap: 10 (ref 5–15)
BUN: 25 mg/dL — ABNORMAL HIGH (ref 6–20)
CO2: 26 mmol/L (ref 22–32)
Calcium: 8.5 mg/dL — ABNORMAL LOW (ref 8.9–10.3)
Chloride: 103 mmol/L (ref 98–111)
Creatinine, Ser: 0.9 mg/dL (ref 0.61–1.24)
GFR calc Af Amer: >60 mL/min (ref >60)
GFR calc non Af Amer: >60 mL/min (ref >60)
Glucose, Bld: 102 mg/dL — ABNORMAL HIGH (ref 70–99)
Potassium: 3.8 mmol/L (ref 3.5–5.1)
Sodium: 139 mmol/L (ref 135–145)
Total Bilirubin: 0.8 mg/dL (ref 0.3–1.2)
Total Protein: 6.7 g/dL (ref 6.5–8.1)

## 2020-02-07 LAB — C-REACTIVE PROTEIN: CRP: 1.9 mg/dL — ABNORMAL HIGH (ref <1.0)

## 2020-02-07 LAB — GLUCOSE, CAPILLARY
Glucose-Capillary: 116 mg/dL — ABNORMAL HIGH (ref 70–99)
Glucose-Capillary: 143 mg/dL — ABNORMAL HIGH (ref 70–99)
Glucose-Capillary: 153 mg/dL — ABNORMAL HIGH (ref 70–99)

## 2020-02-07 LAB — FERRITIN: Ferritin: 1258 ng/mL — ABNORMAL HIGH (ref 24–336)

## 2020-02-07 LAB — FIBRIN DERIVATIVES D-DIMER (ARMC ONLY): Fibrin derivatives D-dimer (ARMC): 1009.87 ng/mL (FEU) — ABNORMAL HIGH (ref 0.00–499.00)

## 2020-02-07 NOTE — Progress Notes (Signed)
[facility]  PROGRESS NOTE  Brett Warner MPN:[id number] DOB: 1970-12-11 DOA: 02/06/2020 PCP: Practice[facility]   Brief HPI:   *Patient admitted this morning, see detailed H&P by Dr Arville Care 49 year old male with a history of hypertension presents with dyspnea and wheezing.  Was found to have COVID-19 positive on Friday.  Patient is requiring 4 L/min of oxygen via nasal cannula.  Heart rate 121.  Respiration 22/min.  Chest x-ray showed diffuse interstitial prominence with patchy left basilar airspace consistent with bronchopneumonia.     Subjective   Patient seen and examined, breathing has improved.  CRP is down to 1.9.  Currently on 4 L/min of oxygen via nasal cannula.   Assessment/Plan:     1. Multifocal pneumonia due to COVID-19 virus-continue remdesivir, Decadron, patient also started on baricitinib.  He is also on antibiotics Rocephin and Zithromax for superimposed bacterial infection.  Continue scheduled Mucinex, bronchodilator MDI, vitamin D3, vitamin C, zinc, p.o. Pepcid, aspirin.  CRP is down to 1.9, ferritin 1258.  Follow CRP, ferritin in a.m. 2. Hypertension-continue Inderal LA.  Patient is also on furosemide 20 mg daily. 3. Hyponatremia-resolved, today sodium is 139.     COVID-19 Labs  Recent Labs    02/06/20 0607 02/07/20 0508  FERRITIN 1,711* 1,258*  LDH 318*  --   CRP 2.9* 1.9*    Lab Results  Component Value Date   SARSCOV2NAA POSITIVE (A) 02/06/2020   SARSCOV2NAA NEGATIVE 04/03/2019     Scheduled medications:   . vitamin C  1,000 mg Oral Daily  . aspirin EC  81 mg Oral Daily  . baricitinib  4 mg Oral Daily  . cholecalciferol  1,000 Units Oral Daily  . dexamethasone (DECADRON) injection  6 mg Intravenous Q24H  . enoxaparin (LOVENOX) injection  40 mg Subcutaneous Q24H  . famotidine  20 mg Oral BID  . furosemide  20 mg Oral Daily  . guaiFENesin  600 mg Oral BID  . propranolol ER  80 mg Oral Daily  . zinc sulfate  220 mg Oral  Daily         CBG: Recent Labs  Lab 02/06/20 2115 02/07/20 0836 02/07/20 1245  GLUCAP 121* 116* 143*    SpO2: 92 % O2 Flow Rate (L/min): 4 L/min    CBC: Recent Labs  Lab 02/05/20 2014 02/07/20 0508  WBC 6.1 5.1  NEUTROABS  --  3.6  HGB 16.0 14.2  HCT 44.4 41.2  MCV 83.0 87.1  PLT 246 261    Basic Metabolic Panel: Recent Labs  Lab 02/05/20 2014 02/07/20 0508  NA 133* 139  K 3.6 3.8  CL 99 103  CO2 22 26  GLUCOSE 118* 102*  BUN 16 25*  CREATININE 0.86 0.90  CALCIUM 8.5* 8.5*     Liver Function Tests: Recent Labs  Lab 02/07/20 0508  AST 44*  ALT 43  ALKPHOS 48  BILITOT 0.8  PROT 6.7  ALBUMIN 3.1*     Antibiotics: Anti-infectives (From admission, onward)   Start     Dose/Rate Route Frequency Ordered Stop   02/07/20 1000  remdesivir 100 mg in sodium chloride 0.9 % 100 mL IVPB       "Followed by" Linked Group Details   100 mg 200 mL/hr over 30 Minutes Intravenous Daily 02/06/20 0349 02/11/20 0959   02/07/20 1000  remdesivir 100 mg in sodium chloride 0.9 % 100 mL IVPB  Status:  Discontinued       "Followed by" Linked Group Details  100 mg 200 mL/hr over 30 Minutes Intravenous Daily 02/06/20 0651 02/06/20 0706   02/06/20 0700  remdesivir 200 mg in sodium chloride 0.9% 250 mL IVPB  Status:  Discontinued       "Followed by" Linked Group Details   200 mg 580 mL/hr over 30 Minutes Intravenous Once 02/06/20 0651 02/06/20 0706   02/06/20 0500  remdesivir 200 mg in sodium chloride 0.9% 250 mL IVPB       "Followed by" Linked Group Details   200 mg 580 mL/hr over 30 Minutes Intravenous Once 02/06/20 0349 02/06/20 0714       DVT prophylaxis: Lovenox  Code Status: Full code  Family Communication: No family at bedside    Status is: Inpatient  Dispo: The patient is from: Home              Anticipated d/c is to: Home              Anticipated d/c date is: 02/11/2020              Patient currently not medically stable for discharge  Barrier  to discharge-COVID-19 pneumonia          Consultants:    Procedures:     Objective   Vitals:   02/07/20 0006 02/07/20 0418 02/07/20 0840 02/07/20 1100  BP: 114/78 123/81 140/90 (!) 140/107  Pulse: 70 68 80 71  Resp: 20 18 18 20   Temp: 98.3 F (36.8 C) 97.9 F (36.6 C) 98 F (36.7 C) 98.3 F (36.8 C)  TempSrc: Oral Oral Oral Oral  SpO2: 94% 93% 94% 92%  Weight:      Height:        Intake/Output Summary (Last 24 hours) at 02/07/2020 1700 Last data filed at 02/07/2020 0400 Gross per 24 hour  Intake 222.89 ml  Output --  Net 222.89 ml    08/17 1901 - 08/19 0700 In: 222.9 [I.V.:222.9] Out: -   Filed Weights   02/05/20 1958  Weight: 114.8 kg    Physical Examination:    General: Appears in no respiratory distress  Cardiovascular: S1-S2, regular, no murmur auscultated  Respiratory: Clear to auscultation bilaterally  Abdomen: Abdomen is soft, nontender, no organomegaly  Extremities: No edema in the lower extremities  Neurologic: Alert, oriented x3, intact insight and judgment    Data Reviewed:   Recent Results (from the past 240 hour(s))  SARS Coronavirus 2 by RT PCR (hospital order, performed in [facility] hospital lab) Nasopharyngeal Nasopharyngeal Swab     Status: Abnormal   Collection Time: 02/06/20 12:11 AM   Specimen: Nasopharyngeal Swab  Result Value Ref Range Status   SARS Coronavirus 2 POSITIVE (A) NEGATIVE Final    Comment: RESULT CALLED TO, READ BACK BY AND VERIFIED WITH: LISA THOMPSON @0257  ON 02/06/20 SKL (NOTE) SARS-CoV-2 target nucleic acids are DETECTED  SARS-CoV-2 RNA is generally detectable in upper respiratory specimens  during the acute phase of infection.  Positive results are indicative  of the presence of the identified virus, but do not rule out bacterial infection or co-infection with other pathogens not detected by the test.  Clinical correlation with patient history and  other diagnostic information is necessary  to determine patient infection status.  The expected result is negative.  Fact Sheet for Patients:     Fact Sheet for Healthcare Providers:   02/08/20    This test is not yet approved or cleared by the [employer] FDA and  has been authorized for  detection and/or diagnosis of SARS-CoV-2 by FDA under an Emergency Use Authorization (EUA).  This EUA will remain in effect (meaning this te st can be used) for the duration of  the COVID-19 declaration under Section 564(b)(1) of the Act, 21 U.S.C. section 360-bbb-3(b)(1), unless the authorization is terminated or revoked sooner.  Performed at [facility], [address] [place], [place] [address]     BNP (last 3 results) Recent Labs    04/03/19 1605 02/06/20 0607  BNP 20.0 34.0    ProBNP (last 3 results) No results for input(s): PROBNP in the last 8760 hours.  Studies:  DG Chest 2 View  Result Date: 02/05/2020 CLINICAL DATA:  Short of breath, chest pain, COVID-19 positive 5 days ago EXAM: CHEST - 2 VIEW COMPARISON:  04/03/2019 FINDINGS: Frontal and lateral views of the chest demonstrate a stable cardiac silhouette. There is increased interstitial prominence since prior study, with patchy consolidation at the left lateral lung base. No effusion or pneumothorax. No acute bony abnormalities. IMPRESSION: 1. Diffuse increased interstitial prominence, with patchy left basilar airspace disease. Findings consistent with bronchopneumonia. Electronically Signed   By: Sharlet Salina M.D.   On: 02/05/2020 20:31       Dellamae Rosamilia S Woodie Trusty   [facility] If 7PM-7AM, please contact night-coverage at [email or web address], Office  [phone]   02/07/2020, 5:00 PM  LOS: 1 day

## 2020-02-08 LAB — CBC WITH DIFFERENTIAL/PLATELET
Abs Immature Granulocytes: 0.04 10*3/uL (ref 0.00–0.07)
Basophils Absolute: 0 10*3/uL (ref 0.0–0.1)
Basophils Relative: 0 %
Eosinophils Absolute: 0 10*3/uL (ref 0.0–0.5)
Eosinophils Relative: 0 %
HCT: 42.3 % (ref 39.0–52.0)
Hemoglobin: 14.5 g/dL (ref 13.0–17.0)
Immature Granulocytes: 1 %
Lymphocytes Relative: 18 %
Lymphs Abs: 1.1 10*3/uL (ref 0.7–4.0)
MCH: 30 pg (ref 26.0–34.0)
MCHC: 34.3 g/dL (ref 30.0–36.0)
MCV: 87.6 fL (ref 80.0–100.0)
Monocytes Absolute: 0.6 10*3/uL (ref 0.1–1.0)
Monocytes Relative: 10 %
Neutro Abs: 4.3 10*3/uL (ref 1.7–7.7)
Neutrophils Relative %: 71 %
Platelets: 364 10*3/uL (ref 150–400)
RBC: 4.83 MIL/uL (ref 4.22–5.81)
RDW: 12.5 % (ref 11.5–15.5)
WBC: 6.1 10*3/uL (ref 4.0–10.5)
nRBC: 0 % (ref 0.0–0.2)

## 2020-02-08 LAB — COMPREHENSIVE METABOLIC PANEL
ALT: 44 U/L (ref 0–44)
AST: 37 U/L (ref 15–41)
Albumin: 3.3 g/dL — ABNORMAL LOW (ref 3.5–5.0)
Alkaline Phosphatase: 49 U/L (ref 38–126)
Anion gap: 10 (ref 5–15)
BUN: 28 mg/dL — ABNORMAL HIGH (ref 6–20)
CO2: 25 mmol/L (ref 22–32)
Calcium: 8.5 mg/dL — ABNORMAL LOW (ref 8.9–10.3)
Chloride: 106 mmol/L (ref 98–111)
Creatinine, Ser: 0.97 mg/dL (ref 0.61–1.24)
GFR calc Af Amer: >60 mL/min (ref >60)
GFR calc non Af Amer: >60 mL/min (ref >60)
Glucose, Bld: 103 mg/dL — ABNORMAL HIGH (ref 70–99)
Potassium: 3.4 mmol/L — ABNORMAL LOW (ref 3.5–5.1)
Sodium: 141 mmol/L (ref 135–145)
Total Bilirubin: 0.7 mg/dL (ref 0.3–1.2)
Total Protein: 6.8 g/dL (ref 6.5–8.1)

## 2020-02-08 LAB — FIBRIN DERIVATIVES D-DIMER (ARMC ONLY): Fibrin derivatives D-dimer (ARMC): 757.64 ng/mL (FEU) — ABNORMAL HIGH (ref 0.00–499.00)

## 2020-02-08 LAB — FERRITIN: Ferritin: 1130 ng/mL — ABNORMAL HIGH (ref 24–336)

## 2020-02-08 LAB — GLUCOSE, CAPILLARY
Glucose-Capillary: 120 mg/dL — ABNORMAL HIGH (ref 70–99)
Glucose-Capillary: 155 mg/dL — ABNORMAL HIGH (ref 70–99)
Glucose-Capillary: 124 mg/dL — ABNORMAL HIGH (ref 70–99)

## 2020-02-08 LAB — C-REACTIVE PROTEIN: CRP: 0.8 mg/dL (ref <1.0)

## 2020-02-08 LAB — ABO/RH: ABO/RH(D): O POS

## 2020-02-08 MED ORDER — FUROSEMIDE 10 MG/ML IJ SOLN
40.0000 mg | Freq: Once | INTRAMUSCULAR | Status: AC
  Administered 2020-02-08: 40 mg via INTRAVENOUS
  Filled 2020-02-08: qty 4

## 2020-02-08 MED ORDER — POTASSIUM CHLORIDE CRYS ER 20 MEQ PO TBCR
40.0000 meq | EXTENDED_RELEASE_TABLET | Freq: Once | ORAL | Status: AC
  Administered 2020-02-08: 40 meq via ORAL
  Filled 2020-02-08: qty 2

## 2020-02-08 NOTE — Progress Notes (Signed)
[facility]  PROGRESS NOTE  Brett Warner ZJQ:[id number] DOB: May 12, 1971 DOA: 02/06/2020 PCP: [facility]   Brief HPI:   *Patient admitted this morning, see detailed H&P by Dr Arville Care 49 year old male with a history of hypertension presents with dyspnea and wheezing.  Was found to have COVID-19 positive on Friday.  Patient is requiring 4 L/min of oxygen via nasal cannula.  Heart rate 121.  Respiration 22/min.  Chest x-ray showed diffuse interstitial prominence with patchy left basilar airspace consistent with bronchopneumonia.     Subjective   Patient seen and examined, breathing better.  Now on 3 L/min of oxygen.    Assessment/Plan:     1. Multifocal pneumonia due to COVID-19 virus-continue remdesivir, Decadron, patient also started on baricitinib.   Continue scheduled Mucinex, bronchodilator MDI, vitamin D3, vitamin C, zinc, p.o. Pepcid, aspirin.  CRP is down to 0.8, ferritin 1130.  Fibrinogen derivative 757.64 . Follow CRP, ferritin in a.m. 2. Hypertension-continue Inderal LA.  Patient is also on furosemide 20 mg daily.  Will DC p.o. furosemide and give 1 dose of Lasix 40 mg IV x1. 3. Hypokalemia-potassium is 3.4, replace potassium and follow BMP in am. 4. Hyponatremia-resolved, today sodium is 139.    COVID-19 Labs  Recent Labs    02/06/20 0607 02/07/20 0508 02/08/20 0503  FERRITIN 1,711* 1,258* 1,130*  LDH 318*  --   --   CRP 2.9* 1.9* 0.8   Lab Results  Component Value Date   SARSCOV2NAA POSITIVE (A) 02/06/2020   SARSCOV2NAA NEGATIVE 04/03/2019     Scheduled medications:   . vitamin C  1,000 mg Oral Daily  . aspirin EC  81 mg Oral Daily  . baricitinib  4 mg Oral Daily  . cholecalciferol  1,000 Units Oral Daily  . dexamethasone (DECADRON) injection  6 mg Intravenous Q24H  . enoxaparin (LOVENOX) injection  40 mg Subcutaneous Q24H  . famotidine  20 mg Oral BID  . furosemide  40 mg Intravenous Once  . guaiFENesin  600 mg Oral BID   . propranolol ER  80 mg Oral Daily  . zinc sulfate  220 mg Oral Daily         CBG: Recent Labs  Lab 02/07/20 0836 02/07/20 1245 02/07/20 1721 02/08/20 0834 02/08/20 1229  GLUCAP 116* 143* 153* 120* 155*    SpO2: 95 % O2 Flow Rate (L/min): 3 L/min    CBC: Recent Labs  Lab 02/05/20 2014 02/07/20 0508 02/08/20 0503  WBC 6.1 5.1 6.1  NEUTROABS  --  3.6 4.3  HGB 16.0 14.2 14.5  HCT 44.4 41.2 42.3  MCV 83.0 87.1 87.6  PLT 246 261 364    Basic Metabolic Panel: Recent Labs  Lab 02/05/20 2014 02/07/20 0508 02/08/20 0503  NA 133* 139 141  K 3.6 3.8 3.4*  CL 99 103 106  CO2 22 26 25   GLUCOSE 118* 102* 103*  BUN 16 25* 28*  CREATININE 0.86 0.90 0.97  CALCIUM 8.5* 8.5* 8.5*     Liver Function Tests: Recent Labs  Lab 02/07/20 0508 02/08/20 0503  AST 44* 37  ALT 43 44  ALKPHOS 48 49  BILITOT 0.8 0.7  PROT 6.7 6.8  ALBUMIN 3.1* 3.3*     Antibiotics: Anti-infectives (From admission, onward)   Start     Dose/Rate Route Frequency Ordered Stop   02/07/20 1000  remdesivir 100 mg in sodium chloride 0.9 % 100 mL IVPB       "Followed by" Linked Group Details  100 mg 200 mL/hr over 30 Minutes Intravenous Daily 02/06/20 0349 02/11/20 0959   02/07/20 1000  remdesivir 100 mg in sodium chloride 0.9 % 100 mL IVPB  Status:  Discontinued       "Followed by" Linked Group Details   100 mg 200 mL/hr over 30 Minutes Intravenous Daily 02/06/20 0651 02/06/20 0706   02/06/20 0700  remdesivir 200 mg in sodium chloride 0.9% 250 mL IVPB  Status:  Discontinued       "Followed by" Linked Group Details   200 mg 580 mL/hr over 30 Minutes Intravenous Once 02/06/20 0651 02/06/20 0706   02/06/20 0500  remdesivir 200 mg in sodium chloride 0.9% 250 mL IVPB       "Followed by" Linked Group Details   200 mg 580 mL/hr over 30 Minutes Intravenous Once 02/06/20 0349 02/06/20 0714       DVT prophylaxis: Lovenox  Code Status: Full code  Family Communication: No family at  bedside    Status is: Inpatient  Dispo: The patient is from: Home              Anticipated d/c is to: Home              Anticipated d/c date is: 02/11/2020              Patient currently not medically stable for discharge  Barrier to discharge-COVID-19 pneumonia          Consultants:    Procedures:     Objective   Vitals:   02/08/20 0520 02/08/20 0833 02/08/20 0836 02/08/20 1230  BP: 115/74 (!) 121/93  111/73  Pulse: 64 74  66  Resp: 16 18  18   Temp: 98.7 F (37.1 C)  (!) 97.4 F (36.3 C) 98 F (36.7 C)  TempSrc: Oral   Oral  SpO2: 93% 94%  95%  Weight:      Height:       No intake or output data in the 24 hours ending 02/08/20 1241  08/18 1901 - 08/20 0700 In: 222.9 [I.V.:222.9] Out: -   Filed Weights   02/05/20 1958  Weight: 114.8 kg    Physical Examination:    General: Appears in no respiratory distress  Cardiovascular: S1-S2, regular, no murmur auscultated  Respiratory: Clear to auscultation bilaterally  Abdomen: Abdomen is soft, nontender, no organomegaly  Extremities: No edema in the lower extremities  Neurologic: Alert, oriented x3, intact insight and judgment    Data Reviewed:   Recent Results (from the past 240 hour(s))  SARS Coronavirus 2 by RT PCR (hospital order, performed in [facility] hospital lab) Nasopharyngeal Nasopharyngeal Swab     Status: Abnormal   Collection Time: 02/06/20 12:11 AM   Specimen: Nasopharyngeal Swab  Result Value Ref Range Status   SARS Coronavirus 2 POSITIVE (A) NEGATIVE Final    Comment: RESULT CALLED TO, READ BACK BY AND VERIFIED WITH: LISA THOMPSON @0257  ON 02/06/20 SKL (NOTE) SARS-CoV-2 target nucleic acids are DETECTED  SARS-CoV-2 RNA is generally detectable in upper respiratory specimens  during the acute phase of infection.  Positive results are indicative  of the presence of the identified virus, but do not rule out bacterial infection or co-infection with other pathogens  not detected by the test.  Clinical correlation with patient history and  other diagnostic information is necessary to determine patient infection status.  The expected result is negative.  Fact Sheet for Patients:     Fact Sheet for Healthcare Providers:  https://pope.com/    This test is not yet approved or cleared by the [place] and  has been authorized for detection and/or diagnosis of SARS-CoV-2 by FDA under an Emergency Use Authorization (EUA).  This EUA will remain in effect (meaning this te st can be used) for the duration of  the COVID-19 declaration under Section 564(b)(1) of the Act, 21 U.S.C. section 360-bbb-3(b)(1), unless the authorization is terminated or revoked sooner.  Performed at [facility], [address] [place], [place] [address]     BNP (last 3 results) Recent Labs    04/03/19 1605 02/06/20 0607  BNP 20.0 34.0    ProBNP (last 3 results) No results for input(s): PROBNP in the last 8760 hours.  Studies:  No results found.     Meredeth Ide   [facility] If 7PM-7AM, please contact night-coverage at [email or web address], Office  [phone]   02/08/2020, 12:41 PM  LOS: 2 days

## 2020-02-09 LAB — COMPREHENSIVE METABOLIC PANEL
ALT: 52 U/L — ABNORMAL HIGH (ref 0–44)
AST: 47 U/L — ABNORMAL HIGH (ref 15–41)
Albumin: 3.4 g/dL — ABNORMAL LOW (ref 3.5–5.0)
Alkaline Phosphatase: 45 U/L (ref 38–126)
Anion gap: 11 (ref 5–15)
BUN: 31 mg/dL — ABNORMAL HIGH (ref 6–20)
CO2: 25 mmol/L (ref 22–32)
Calcium: 8.8 mg/dL — ABNORMAL LOW (ref 8.9–10.3)
Chloride: 107 mmol/L (ref 98–111)
Creatinine, Ser: 0.96 mg/dL (ref 0.61–1.24)
GFR calc Af Amer: >60 mL/min (ref >60)
GFR calc non Af Amer: >60 mL/min (ref >60)
Glucose, Bld: 93 mg/dL (ref 70–99)
Potassium: 3.8 mmol/L (ref 3.5–5.1)
Sodium: 143 mmol/L (ref 135–145)
Total Bilirubin: 0.8 mg/dL (ref 0.3–1.2)
Total Protein: 6.9 g/dL (ref 6.5–8.1)

## 2020-02-09 LAB — CBC WITH DIFFERENTIAL/PLATELET
Abs Immature Granulocytes: 0.04 10*3/uL (ref 0.00–0.07)
Basophils Absolute: 0 10*3/uL (ref 0.0–0.1)
Basophils Relative: 0 %
Eosinophils Absolute: 0 10*3/uL (ref 0.0–0.5)
Eosinophils Relative: 0 %
HCT: 42.7 % (ref 39.0–52.0)
Hemoglobin: 14.7 g/dL (ref 13.0–17.0)
Immature Granulocytes: 1 %
Lymphocytes Relative: 20 %
Lymphs Abs: 1.5 10*3/uL (ref 0.7–4.0)
MCH: 30.3 pg (ref 26.0–34.0)
MCHC: 34.4 g/dL (ref 30.0–36.0)
MCV: 88 fL (ref 80.0–100.0)
Monocytes Absolute: 0.7 10*3/uL (ref 0.1–1.0)
Monocytes Relative: 9 %
Neutro Abs: 5.3 10*3/uL (ref 1.7–7.7)
Neutrophils Relative %: 70 %
Platelets: 376 10*3/uL (ref 150–400)
RBC: 4.85 MIL/uL (ref 4.22–5.81)
RDW: 12.3 % (ref 11.5–15.5)
WBC: 7.6 10*3/uL (ref 4.0–10.5)
nRBC: 0 % (ref 0.0–0.2)

## 2020-02-09 LAB — FERRITIN: Ferritin: 883 ng/mL — ABNORMAL HIGH (ref 24–336)

## 2020-02-09 LAB — C-REACTIVE PROTEIN: CRP: 0.6 mg/dL (ref <1.0)

## 2020-02-09 LAB — FIBRIN DERIVATIVES D-DIMER (ARMC ONLY): Fibrin derivatives D-dimer (ARMC): 561.85 ng/mL (FEU) — ABNORMAL HIGH (ref 0.00–499.00)

## 2020-02-09 MED ORDER — FUROSEMIDE 20 MG PO TABS
20.0000 mg | ORAL_TABLET | Freq: Every day | ORAL | Status: DC
  Administered 2020-02-09 – 2020-02-10 (×2): 20 mg via ORAL
  Filled 2020-02-09 (×2): qty 1

## 2020-02-09 NOTE — Progress Notes (Signed)
[facility]  PROGRESS NOTE  Brett Warner IOX:[id number] DOB: 1970/12/14 DOA: 02/06/2020 PCP: Practice, [facility]   Brief HPI:   *Patient admitted this morning, see detailed H&P by Dr Arville Care 49 year old male with a history of hypertension presents with dyspnea and wheezing.  Was found to have COVID-19 positive on Friday.  Patient is requiring 4 L/min of oxygen via nasal cannula.  Heart rate 121.  Respiration 22/min.  Chest x-ray showed diffuse interstitial prominence with patchy left basilar airspace consistent with bronchopneumonia.   Subjective   Patient seen and examined, breathing is improved.  He is not requiring oxygen at this time.  Excellent diuresis with IV Lasix.    Assessment/Plan:     1. Multifocal pneumonia due to COVID-19 virus-continue remdesivir, Decadron, patient also started on baricitinib.   Continue scheduled Mucinex, bronchodilator MDI, vitamin D3, vitamin C, zinc, p.o. Pepcid, aspirin.  CRP is down to 0.8, ferritin 1130.  Fibrinogen derivative 757.64 . Follow CRP, ferritin in a.m. 2. Hypertension-continue Inderal LA.  Will restart furosemide 20 mg p.o. daily 3. Hypokalemia-replete 4. Hyponatremia-resolved, today sodium is 139.    COVID-19 Labs  Recent Labs    02/07/20 0508 02/08/20 0503 02/09/20 0556  FERRITIN 1,258* 1,130* 883*  CRP 1.9* 0.8  --    Lab Results  Component Value Date   SARSCOV2NAA POSITIVE (A) 02/06/2020   SARSCOV2NAA NEGATIVE 04/03/2019     Scheduled medications:   . vitamin C  1,000 mg Oral Daily  . aspirin EC  81 mg Oral Daily  . baricitinib  4 mg Oral Daily  . cholecalciferol  1,000 Units Oral Daily  . dexamethasone (DECADRON) injection  6 mg Intravenous Q24H  . enoxaparin (LOVENOX) injection  40 mg Subcutaneous Q24H  . famotidine  20 mg Oral BID  . guaiFENesin  600 mg Oral BID  . propranolol ER  80 mg Oral Daily  . zinc sulfate  220 mg Oral Daily         CBG: Recent Labs  Lab 02/07/20 1245  02/07/20 1721 02/08/20 0834 02/08/20 1229 02/08/20 1629  GLUCAP 143* 153* 120* 155* 124*    SpO2: 96 % O2 Flow Rate (L/min): 1 L/min    CBC: Recent Labs  Lab 02/05/20 2014 02/07/20 0508 02/08/20 0503 02/09/20 0556  WBC 6.1 5.1 6.1 7.6  NEUTROABS  --  3.6 4.3 5.3  HGB 16.0 14.2 14.5 14.7  HCT 44.4 41.2 42.3 42.7  MCV 83.0 87.1 87.6 88.0  PLT 246 261 364 376    Basic Metabolic Panel: Recent Labs  Lab 02/05/20 2014 02/07/20 0508 02/08/20 0503 02/09/20 0556  NA 133* 139 141 143  K 3.6 3.8 3.4* 3.8  CL 99 103 106 107  CO2 22 26 25 25   GLUCOSE 118* 102* 103* 93  BUN 16 25* 28* 31*  CREATININE 0.86 0.90 0.97 0.96  CALCIUM 8.5* 8.5* 8.5* 8.8*     Liver Function Tests: Recent Labs  Lab 02/07/20 0508 02/08/20 0503 02/09/20 0556  AST 44* 37 47*  ALT 43 44 52*  ALKPHOS 48 49 45  BILITOT 0.8 0.7 0.8  PROT 6.7 6.8 6.9  ALBUMIN 3.1* 3.3* 3.4*     Antibiotics: Anti-infectives (From admission, onward)   Start     Dose/Rate Route Frequency Ordered Stop   02/07/20 1000  remdesivir 100 mg in sodium chloride 0.9 % 100 mL IVPB       "Followed by" Linked Group Details   100 mg 200 mL/hr over 30 Minutes  Intravenous Daily 02/06/20 0349 02/11/20 0959   02/07/20 1000  remdesivir 100 mg in sodium chloride 0.9 % 100 mL IVPB  Status:  Discontinued       "Followed by" Linked Group Details   100 mg 200 mL/hr over 30 Minutes Intravenous Daily 02/06/20 0651 02/06/20 0706   02/06/20 0700  remdesivir 200 mg in sodium chloride 0.9% 250 mL IVPB  Status:  Discontinued       "Followed by" Linked Group Details   200 mg 580 mL/hr over 30 Minutes Intravenous Once 02/06/20 0651 02/06/20 0706   02/06/20 0500  remdesivir 200 mg in sodium chloride 0.9% 250 mL IVPB       "Followed by" Linked Group Details   200 mg 580 mL/hr over 30 Minutes Intravenous Once 02/06/20 0349 02/06/20 0714       DVT prophylaxis: Lovenox  Code Status: Full code  Family Communication: No family at  bedside    Status is: Inpatient  Dispo: The patient is from: Home              Anticipated d/c is to: Home              Anticipated d/c date is: 02/10/2020              Patient currently not medically stable for discharge  Barrier to discharge-COVID-19 pneumonia          Consultants:    Procedures:     Objective   Vitals:   02/09/20 0109 02/09/20 0509 02/09/20 0813 02/09/20 1119  BP: 113/78 113/74 (!) 142/86 130/85  Pulse: 65 64 63 72  Resp: 18 19 18 20   Temp: 97.9 F (36.6 C) 98.1 F (36.7 C) 98.4 F (36.9 C) 98.1 F (36.7 C)  TempSrc:   Oral Oral  SpO2: 92% 100% 92% 96%  Weight:      Height:        Intake/Output Summary (Last 24 hours) at 02/09/2020 1229 Last data filed at 02/08/2020 1900 Gross per 24 hour  Intake 299.64 ml  Output 1130 ml  Net -830.36 ml    08/19 1901 - 08/21 0700 In: 299.6 [I.V.:99.6] Out: 1130 [Urine:1130]  Filed Weights   02/05/20 1958  Weight: 114.8 kg    Physical Examination:    General-appears in no acute distress  Heart-S1-S2, regular, no murmur auscultated  Lungs-clear to auscultation bilaterally, no wheezing or crackles auscultated  Abdomen-soft, nontender, no organomegaly  Extremities-no edema in the lower extremities  Neuro-alert, oriented x3, no focal deficit noted    Data Reviewed:   Recent Results (from the past 240 hour(s))  SARS Coronavirus 2 by RT PCR (hospital order, performed in [facility] hospital lab) Nasopharyngeal Nasopharyngeal Swab     Status: Abnormal   Collection Time: 02/06/20 12:11 AM   Specimen: Nasopharyngeal Swab  Result Value Ref Range Status   SARS Coronavirus 2 POSITIVE (A) NEGATIVE Final    Comment: RESULT CALLED TO, READ BACK BY AND VERIFIED WITH: LISA THOMPSON @0257  ON 02/06/20 SKL (NOTE) SARS-CoV-2 target nucleic acids are DETECTED  SARS-CoV-2 RNA is generally detectable in upper respiratory specimens  during the acute phase of infection.  Positive results are  indicative  of the presence of the identified virus, but do not rule out bacterial infection or co-infection with other pathogens not detected by the test.  Clinical correlation with patient history and  other diagnostic information is necessary to determine patient infection status.  The expected result is negative.  Fact Sheet  for Patients:   BoilerBrush.com.cy   Fact Sheet for Healthcare Providers:   https://pope.com/    This test is not yet approved or cleared by the [place] FDA and  has been authorized for detection and/or diagnosis of SARS-CoV-2 by FDA under an Emergency Use Authorization (EUA).  This EUA will remain in effect (meaning this te st can be used) for the duration of  the COVID-19 declaration under Section 564(b)(1) of the Act, 21 U.S.C. section 360-bbb-3(b)(1), unless the authorization is terminated or revoked sooner.  Performed at [facility], [address] [place], [place] [address]     BNP (last 3 results) Recent Labs    04/03/19 1605 02/06/20 0607  BNP 20.0 34.0    ProBNP (last 3 results) No results for input(s): PROBNP in the last 8760 hours.  Studies:  No results found.     Meredeth Ide   [facility] If 7PM-7AM, please contact night-coverage at [email or web address], Office  [phone]   02/09/2020, 12:29 PM  LOS: 3 days

## 2020-02-09 NOTE — Progress Notes (Signed)
Patient was removed from oxygen at [facility] to room air. O2 sats are remaining 92%. He is complaining of any SOB or distress, resting in bed with call bell in reach. All personal items within reach.

## 2020-02-10 LAB — COMPREHENSIVE METABOLIC PANEL
ALT: 67 U/L — ABNORMAL HIGH (ref 0–44)
AST: 48 U/L — ABNORMAL HIGH (ref 15–41)
Albumin: 3.3 g/dL — ABNORMAL LOW (ref 3.5–5.0)
Alkaline Phosphatase: 45 U/L (ref 38–126)
Anion gap: 12 (ref 5–15)
BUN: 28 mg/dL — ABNORMAL HIGH (ref 6–20)
CO2: 25 mmol/L (ref 22–32)
Calcium: 8.9 mg/dL (ref 8.9–10.3)
Chloride: 104 mmol/L (ref 98–111)
Creatinine, Ser: 0.91 mg/dL (ref 0.61–1.24)
GFR calc Af Amer: >60 mL/min (ref >60)
GFR calc non Af Amer: >60 mL/min (ref >60)
Glucose, Bld: 105 mg/dL — ABNORMAL HIGH (ref 70–99)
Potassium: 3.8 mmol/L (ref 3.5–5.1)
Sodium: 141 mmol/L (ref 135–145)
Total Bilirubin: 0.9 mg/dL (ref 0.3–1.2)
Total Protein: 6.7 g/dL (ref 6.5–8.1)

## 2020-02-10 LAB — CBC WITH DIFFERENTIAL/PLATELET
Abs Immature Granulocytes: 0.08 10*3/uL — ABNORMAL HIGH (ref 0.00–0.07)
Basophils Absolute: 0 10*3/uL (ref 0.0–0.1)
Basophils Relative: 0 %
Eosinophils Absolute: 0.1 10*3/uL (ref 0.0–0.5)
Eosinophils Relative: 1 %
HCT: 42.3 % (ref 39.0–52.0)
Hemoglobin: 14.9 g/dL (ref 13.0–17.0)
Immature Granulocytes: 1 %
Lymphocytes Relative: 14 %
Lymphs Abs: 1.3 10*3/uL (ref 0.7–4.0)
MCH: 30.1 pg (ref 26.0–34.0)
MCHC: 35.2 g/dL (ref 30.0–36.0)
MCV: 85.5 fL (ref 80.0–100.0)
Monocytes Absolute: 0.7 10*3/uL (ref 0.1–1.0)
Monocytes Relative: 7 %
Neutro Abs: 7 10*3/uL (ref 1.7–7.7)
Neutrophils Relative %: 77 %
Platelets: 405 10*3/uL — ABNORMAL HIGH (ref 150–400)
RBC: 4.95 MIL/uL (ref 4.22–5.81)
RDW: 12.4 % (ref 11.5–15.5)
WBC: 9.1 10*3/uL (ref 4.0–10.5)
nRBC: 0 % (ref 0.0–0.2)

## 2020-02-10 LAB — C-REACTIVE PROTEIN: CRP: 0.6 mg/dL (ref <1.0)

## 2020-02-10 LAB — FIBRIN DERIVATIVES D-DIMER (ARMC ONLY): Fibrin derivatives D-dimer (ARMC): 520.01 ng/mL (FEU) — ABNORMAL HIGH (ref 0.00–499.00)

## 2020-02-10 LAB — FERRITIN: Ferritin: 880 ng/mL — ABNORMAL HIGH (ref 24–336)

## 2020-02-10 MED ORDER — GUAIFENESIN ER 600 MG PO TB12: 600.0000 mg | ORAL_TABLET | Freq: Two times a day (BID) | ORAL | 0 refills | Status: AC

## 2020-02-10 MED ORDER — ASCORBIC ACID 1000 MG PO TABS
1000.0000 mg | ORAL_TABLET | Freq: Every day | ORAL | 0 refills | Status: DC
Start: 2020-02-11 — End: 2020-05-13

## 2020-02-10 MED ORDER — DEXAMETHASONE 6 MG PO TABS: 6.0000 mg | ORAL_TABLET | Freq: Every day | ORAL | 0 refills | Status: AC

## 2020-02-10 MED ORDER — ZINC SULFATE 220 (50 ZN) MG PO CAPS: 220.0000 mg | ORAL_CAPSULE | Freq: Every day | ORAL | 0 refills | Status: DC

## 2020-02-10 MED ORDER — VITAMIN D3 25 MCG PO TABS: 1000.0000 [IU] | ORAL_TABLET | Freq: Every day | ORAL | 0 refills | Status: DC

## 2020-02-10 NOTE — Discharge Instructions (Signed)
COVID-19 COVID-19 is a respiratory infection that is caused by a virus called severe acute respiratory syndrome coronavirus 2 (SARS-CoV-2). The disease is also known as coronavirus disease or novel coronavirus. In some people, the virus may not cause any symptoms. In others, it may cause a serious infection. The infection can get worse quickly and can lead to complications, such as:  Pneumonia, or infection of the lungs.  Acute respiratory distress syndrome or ARDS. This is a condition in which fluid build-up in the lungs prevents the lungs from filling with air and passing oxygen into the blood.  Acute respiratory failure. This is a condition in which there is not enough oxygen passing from the lungs to the body or when carbon dioxide is not passing from the lungs out of the body.  Sepsis or septic shock. This is a serious bodily reaction to an infection.  Blood clotting problems.  Secondary infections due to bacteria or fungus.  Organ failure. This is when your body's organs stop working. The virus that causes COVID-19 is contagious. This means that it can spread from person to person through droplets from coughs and sneezes (respiratory secretions). What are the causes? This illness is caused by a virus. You may catch the virus by:  Breathing in droplets from an infected person. Droplets can be spread by a person breathing, speaking, singing, coughing, or sneezing.  Touching something, like a table or a doorknob, that was exposed to the virus (contaminated) and then touching your mouth, nose, or eyes. What increases the risk? Risk for infection You are more likely to be infected with this virus if you:  Are within 6 feet (2 meters) of a person with COVID-19.  Provide care for or live with a person who is infected with COVID-19.  Spend time in crowded indoor spaces or live in shared housing. Risk for serious illness You are more likely to become seriously ill from the virus if  you:  Are 50 years of age or older. The higher your age, the more you are at risk for serious illness.  Live in a nursing home or long-term care facility.  Have cancer.  Have a long-term (chronic) disease such as: ? Chronic lung disease, including chronic obstructive pulmonary disease or asthma. ? A long-term disease that lowers your body's ability to fight infection (immunocompromised). ? Heart disease, including heart failure, a condition in which the arteries that lead to the heart become narrow or blocked (coronary artery disease), a disease which makes the heart muscle thick, weak, or stiff (cardiomyopathy). ? Diabetes. ? Chronic kidney disease. ? Sickle cell disease, a condition in which red blood cells have an abnormal "sickle" shape. ? Liver disease.  Are obese. What are the signs or symptoms? Symptoms of this condition can range from mild to severe. Symptoms may appear any time from 2 to 14 days after being exposed to the virus. They include:  A fever or chills.  A cough.  Difficulty breathing.  Headaches, body aches, or muscle aches.  Runny or stuffy (congested) nose.  A sore throat.  New loss of taste or smell. Some people may also have stomach problems, such as nausea, vomiting, or diarrhea. Other people may not have any symptoms of COVID-19. How is this diagnosed? This condition may be diagnosed based on:  Your signs and symptoms, especially if: ? You live in an area with a COVID-19 outbreak. ? You recently traveled to or from an area where the virus is common. ? You   provide care for or live with a person who was diagnosed with COVID-19. ? You were exposed to a person who was diagnosed with COVID-19.  A physical exam.  Lab tests, which may include: ? Taking a sample of fluid from the back of your nose and throat (nasopharyngeal fluid), your nose, or your throat using a swab. ? A sample of mucus from your lungs (sputum). ? Blood tests.  Imaging tests,  which may include, X-rays, CT scan, or ultrasound. How is this treated? At present, there is no medicine to treat COVID-19. Medicines that treat other diseases are being used on a trial basis to see if they are effective against COVID-19. Your health care provider will talk with you about ways to treat your symptoms. For most people, the infection is mild and can be managed at home with rest, fluids, and over-the-counter medicines. Treatment for a serious infection usually takes places in a hospital intensive care unit (ICU). It may include one or more of the following treatments. These treatments are given until your symptoms improve.  Receiving fluids and medicines through an IV.  Supplemental oxygen. Extra oxygen is given through a tube in the nose, a face mask, or a hood.  Positioning you to lie on your stomach (prone position). This makes it easier for oxygen to get into the lungs.  Continuous positive airway pressure (CPAP) or bi-level positive airway pressure (BPAP) machine. This treatment uses mild air pressure to keep the airways open. A tube that is connected to a motor delivers oxygen to the body.  Ventilator. This treatment moves air into and out of the lungs by using a tube that is placed in your windpipe.  Tracheostomy. This is a procedure to create a hole in the neck so that a breathing tube can be inserted.  Extracorporeal membrane oxygenation (ECMO). This procedure gives the lungs a chance to recover by taking over the functions of the heart and lungs. It supplies oxygen to the body and removes carbon dioxide. Follow these instructions at home: Lifestyle  If you are sick, stay home except to get medical care. Your health care provider will tell you how long to stay home. Call your health care provider before you go for medical care.  Rest at home as told by your health care provider.  Do not use any products that contain nicotine or tobacco, such as cigarettes,  e-cigarettes, and chewing tobacco. If you need help quitting, ask your health care provider.  Return to your normal activities as told by your health care provider. Ask your health care provider what activities are safe for you. General instructions  Take over-the-counter and prescription medicines only as told by your health care provider.  Drink enough fluid to keep your urine pale yellow.  Keep all follow-up visits as told by your health care provider. This is important. How is this prevented?  There is no vaccine to help prevent COVID-19 infection. However, there are steps you can take to protect yourself and others from this virus. To protect yourself:   Do not travel to areas where COVID-19 is a risk. The areas where COVID-19 is reported change often. To identify high-risk areas and travel restrictions, check the CDC travel website: wwwnc.cdc.gov/travel/notices  If you live in, or must travel to, an area where COVID-19 is a risk, take precautions to avoid infection. ? Stay away from people who are sick. ? Wash your hands often with soap and water for 20 seconds. If soap and water   are not available, use an alcohol-based hand sanitizer. ? Avoid touching your mouth, face, eyes, or nose. ? Avoid going out in public, follow guidance from your state and local health authorities. ? If you must go out in public, wear a cloth face covering or face mask. Make sure your mask covers your nose and mouth. ? Avoid crowded indoor spaces. Stay at least 6 feet (2 meters) away from others. ? Disinfect objects and surfaces that are frequently touched every day. This may include:  Counters and tables.  Doorknobs and light switches.  Sinks and faucets.  Electronics, such as phones, remote controls, keyboards, computers, and tablets. To protect others: If you have symptoms of COVID-19, take steps to prevent the virus from spreading to others.  If you think you have a COVID-19 infection, contact  your health care provider right away. Tell your health care team that you think you may have a COVID-19 infection.  Stay home. Leave your house only to seek medical care. Do not use public transport.  Do not travel while you are sick.  Wash your hands often with soap and water for 20 seconds. If soap and water are not available, use alcohol-based hand sanitizer.  Stay away from other members of your household. Let healthy household members care for children and pets, if possible. If you have to care for children or pets, wash your hands often and wear a mask. If possible, stay in your own room, separate from others. Use a different bathroom.  Make sure that all people in your household wash their hands well and often.  Cough or sneeze into a tissue or your sleeve or elbow. Do not cough or sneeze into your hand or into the air.  Wear a cloth face covering or face mask. Make sure your mask covers your nose and mouth. Where to find more information  Centers for Disease Control and Prevention: www.cdc.gov/coronavirus/2019-ncov/index.html  World Health Organization: www.who.int/health-topics/coronavirus Contact a health care provider if:  You live in or have traveled to an area where COVID-19 is a risk and you have symptoms of the infection.  You have had contact with someone who has COVID-19 and you have symptoms of the infection. Get help right away if:  You have trouble breathing.  You have pain or pressure in your chest.  You have confusion.  You have bluish lips and fingernails.  You have difficulty waking from sleep.  You have symptoms that get worse. These symptoms may represent a serious problem that is an emergency. Do not wait to see if the symptoms will go away. Get medical help right away. Call your local emergency services (911 in the [place] Do not drive yourself to the hospital. Let the emergency medical personnel know if you think you have  COVID-19. Summary  COVID-19 is a respiratory infection that is caused by a virus. It is also known as coronavirus disease or novel coronavirus. It can cause serious infections, such as pneumonia, acute respiratory distress syndrome, acute respiratory failure, or sepsis.  The virus that causes COVID-19 is contagious. This means that it can spread from person to person through droplets from breathing, speaking, singing, coughing, or sneezing.  You are more likely to develop a serious illness if you are 50 years of age or older, have a weak immune system, live in a nursing home, or have chronic disease.  There is no medicine to treat COVID-19. Your health care provider will talk with you about ways to treat your symptoms.    Take steps to protect yourself and others from infection. Wash your hands often and disinfect objects and surfaces that are frequently touched every day. Stay away from people who are sick and wear a mask if you are sick. This information is not intended to replace advice given to you by your health care provider. Make sure you discuss any questions you have with your health care provider. Document Revised: 04/06/2019 Document Reviewed: 07/13/2018 Elsevier Patient Education  2020 [employer].  ith your health care provider. Document Revised: 05/24/2019 Document Reviewed: 05/24/2019 Elsevier Patient Education  2020 [employer] Prevent the Spread of COVID-19 if You Are Sick If you are sick with COVID-19 or think you might have COVID-19, follow the steps below to care for yourself and to help protect other people in your home and community. Stay home except to get medical care.  Stay home. Most people with COVID-19 have mild illness and are able to recover at home without medical care. Do not leave your home, except to get medical care. Do not visit public areas.  Take care of yourself. Get rest and stay hydrated. Take over-the-counter medicines, such as acetaminophen, to  help you feel better.  Stay in touch with your doctor. Call before you get medical care. Be sure to get care if you have trouble breathing, or have any other emergency warning signs, or if you think it is an emergency.  Avoid public transportation, ride-sharing, or taxis. Separate yourself from other people and pets in your home.  As much as possible, stay in a specific room and away from other people and pets in your home. Also, you should use a separate bathroom, if available. If you need to be around other people or animals in or outside of the home, wear a mask. ? See COVID-19 and Animals if you have questions about [email or web address]. ? Additional guidance is available for those living in close quarters. (http://white.info/.html) and shared housing ([employer]co.uk). Monitor your symptoms.  Symptoms of COVID-19 include fever, cough, and shortness of breath but other symptoms may be present as well.  Follow care instructions from your healthcare provider and local health department. Your local health authorities will give instructions on checking your symptoms and reporting information. When to Seek Emergency Medical Attention Look for emergency warning signs* for COVID-19. If someone is showing any of these signs, seek emergency medical care immediately:  Trouble breathing  Persistent pain or pressure in the chest  New confusion  Bluish lips or face  Inability to wake or stay awake *This list is not all possible symptoms. Please call your medical provider for any other symptoms that are severe or concerning to you. Call 911 or call ahead to your local emergency facility: Notify the operator that you are seeking care for someone who has or may have COVID-19. Call ahead before visiting your doctor.  Call  ahead. Many medical visits for routine care are being postponed or done by phone or telemedicine.  If you have a medical appointment that cannot be postponed, call your doctor's office, and tell them you have or may have COVID-19. If you are sick, wear a mask over your nose and mouth.  You should wear a mask over your nose and mouth if you must be around other people or animals, including pets (even at home).  You don't need to wear the mask if you are alone. If you can't put on a mask (because of trouble breathing for example), cover your coughs and sneezes in  some other way. Try to stay at least 6 feet away from other people. This will help protect the people around you.  Masks should not be placed on young children under age 42 years, anyone who has trouble breathing, or anyone who is not able to remove the mask without help. Note: During the COVID-19 pandemic, medical grade facemasks are reserved for healthcare workers and some first responders. You may need to make a mask using a scarf or bandana. Cover your coughs and sneezes.  Cover your mouth and nose with a tissue when you cough or sneeze.  Throw used tissues in a lined trash can.  Immediately wash your hands with soap and water for at least 20 seconds. If soap and water are not available, clean your hands with an alcohol-based hand sanitizer that contains at least 60% alcohol. Clean your hands often.  Wash your hands often with soap and water for at least 20 seconds. This is especially important after blowing your nose, coughing, or sneezing; going to the bathroom; and before eating or preparing food.  Use hand sanitizer if soap and water are not available. Use an alcohol-based hand sanitizer with at least 60% alcohol, covering all surfaces of your hands and rubbing them together until they feel dry.  Soap and water are the best option, especially if your hands are visibly dirty.  Avoid touching your eyes, nose, and mouth with  unwashed hands. Avoid sharing personal household items.  Do not share dishes, drinking glasses, cups, eating utensils, towels, or bedding with other people in your home.  Wash these items thoroughly after using them with soap and water or put them in the dishwasher. Clean all "high-touch" surfaces everyday.  Clean and disinfect high-touch surfaces in your "sick room" and bathroom. Let someone else clean and disinfect surfaces in common areas, but not your bedroom and bathroom.  If a caregiver or other person needs to clean and disinfect a sick person's bedroom or bathroom, they should do so on an as-needed basis. The caregiver/other person should wear a mask and wait as long as possible after the sick person has used the bathroom. High-touch surfaces include phones, remote controls, counters, tabletops, doorknobs, bathroom fixtures, toilets, keyboards, tablets, and bedside tables.  Clean and disinfect areas that may have blood, stool, or body fluids on them.  Use household cleaners and disinfectants. Clean the area or item with soap and water or another detergent if it is dirty. Then use a household disinfectant. ? Be sure to follow the instructions on the label to ensure safe and effective use of the product. Many products recommend keeping the surface wet for several minutes to ensure germs are killed. Many also recommend precautions such as wearing gloves and making sure you have good ventilation during use of the product. ? Most EPA-registered household disinfectants should be effective. When you can be around others after you had or likely had COVID-19 When you can be around others (end home isolation) depends on different factors for different situations.  I think or know I had COVID-19, and I had symptoms ? You can be with others after  24 hours with no fever AND  Symptoms improved AND  10 days since symptoms first appeared ? Depending on your healthcare provider's advice and  availability of testing, you might get tested to see if you still have COVID-19. If you will be tested, you can be around others when you have no fever, symptoms have improved, and you receive two negative  test results in a row, at least 24 hours apart.  I tested positive for COVID-19 but had no symptoms ? If you continue to have no symptoms, you can be with others after:  10 days have passed since test ? Depending on your healthcare provider's advice and availability of testing, you might get tested to see if you still have COVID-19. If you will be tested, you can be around others after you receive two negative test results in a row, at least 24 hours apart. ? If you develop symptoms after testing positive, follow the guidance above for "I think or know I had COVID, and I had symptoms." [email or web address] 01/30/2019 This information is not intended to replace advice given to you by your health care provider. Make sure you discuss any questions you have with your health care provider. Document Revised: 02/15/2019 Document Reviewed: 12/19/2018 Elsevier Patient Education  2020 [employer].

## 2020-02-10 NOTE — Discharge Summary (Addendum)
Physician Discharge Summary  Brett Warner UUV:[id number] DOB: 07-08-1970 DOA: 02/06/2020  PCP: [facility]  Admit date: 02/06/2020 Discharge date: 02/10/2020  Time spent: 50* minutes  Recommendations for Outpatient Follow-up:  1. Follow-up PCP in 3 weeks 2. Home isolation for 3 weeks till 02/27/2020   Discharge Diagnoses:  Active Problems:   Pneumonia due to COVID-19 virus   Discharge Condition: Stable  Diet recommendation:  Heart healthy diet  Filed Weights   02/05/20 1958  Weight: 114.8 kg    History of present illness:   49 year old male with a history of hypertension presents with dyspnea and wheezing. Was found to have COVID-19 positive on Friday. Patient is requiring 4 L/min of oxygen via nasal cannula. Heart rate 121. Respiration 22/min. Chest x-ray showed diffuse interstitial prominence with patchy left basilar airspace consistent with bronchopneumonia.   Hospital Course:  1. *Multifocal pneumonia due to COVID-19 virus-patient was started on  remdesivir, Decadron, patient also started on baricitinib.    Patient is now significantly improved.  He is not requiring oxygen.  He has completed 5 days treatment with remdesivir.  Will discontinue baricitinib.   CRP is down to 0.6.  Fibroid elevated 561.85.  Will discharge home on Decadron 5 mg p.o. daily.  Will continue vitamin C, vitamin D and zinc sulfate for 5 more days. 2. Hypertension-continue Inderal LA.    Continue furosemide 20 mg p.o. daily. 3. Hypokalemia-replete 4. Hyponatremia-resolved, today sodium is 139.   Procedures:    Consultations:  Discharge Exam: Vitals:   02/10/20 0821 02/10/20 1212  BP: 107/73 124/89  Pulse: 74 70  Resp: 16 16  Temp: 97.6 F (36.4 C) 97.9 F (36.6 C)  SpO2: 92% 95%    General: Appears in no acute distress Cardiovascular: S1-S2, regular, no murmur auscultated Respiratory: Clear to auscultation bilaterally, no wheezing or crackles  auscultated.  Discharge Instructions   Discharge Instructions    Diet - low sodium heart healthy   Complete by: As directed    Discharge instructions   Complete by: As directed    Home isolation for total 3 weeks starting from 02/06/20 till 02/27/20   Increase activity slowly   Complete by: As directed      Allergies as of 02/10/2020   No Known Allergies     Medication List    STOP taking these medications   predniSONE 20 MG tablet Commonly known as: DELTASONE     TAKE these medications   ascorbic acid 1000 MG tablet Commonly known as: VITAMIN C Take 1 tablet (1,000 mg total) by mouth daily. Start taking on: February 11, 2020   dexamethasone 6 MG tablet Commonly known as: DECADRON Take 1 tablet (6 mg total) by mouth daily for 5 days.   furosemide 20 MG tablet Commonly known as: LASIX Take 20 mg by mouth daily.   guaiFENesin 600 MG 12 hr tablet Commonly known as: MUCINEX Take 1 tablet (600 mg total) by mouth 2 (two) times daily for 5 days.   propranolol ER 80 MG 24 hr capsule Commonly known as: INDERAL LA Take 80 mg by mouth daily.   Vitamin D3 25 MCG tablet Commonly known as: Vitamin D Take 1 tablet (1,000 Units total) by mouth daily. Start taking on: February 11, 2020   zinc sulfate 220 (50 Zn) MG capsule Take 1 capsule (220 mg total) by mouth daily. Start taking on: February 11, 2020      No Known Allergies  Follow-up Information    Practice, [facility]. Schedule an appointment as soon as possible for a visit in 3 weeks.   Contact information: [address] [place] [address] [phone]                The results of significant diagnostics from this hospitalization (including imaging, microbiology, ancillary and laboratory) are listed below for reference.    Significant Diagnostic Studies: DG Chest 2 View  Result Date: 02/05/2020 CLINICAL DATA:  Short of breath, chest pain, COVID-19 positive 5 days ago EXAM: CHEST - 2  VIEW COMPARISON:  04/03/2019 FINDINGS: Frontal and lateral views of the chest demonstrate a stable cardiac silhouette. There is increased interstitial prominence since prior study, with patchy consolidation at the left lateral lung base. No effusion or pneumothorax. No acute bony abnormalities. IMPRESSION: 1. Diffuse increased interstitial prominence, with patchy left basilar airspace disease. Findings consistent with bronchopneumonia. Electronically Signed   By: Sharlet Salina M.D.   On: 02/05/2020 20:31    Microbiology: Recent Results (from the past 240 hour(s))  SARS Coronavirus 2 by RT PCR (hospital order, performed in [facility] hospital lab) Nasopharyngeal Nasopharyngeal Swab     Status: Abnormal   Collection Time: 02/06/20 12:11 AM   Specimen: Nasopharyngeal Swab  Result Value Ref Range Status   SARS Coronavirus 2 POSITIVE (A) NEGATIVE Final    Comment: RESULT CALLED TO, READ BACK BY AND VERIFIED WITH: LISA THOMPSON @0257  ON 02/06/20 [facility] (NOTE) SARS-CoV-2 target nucleic acids are DETECTED  SARS-CoV-2 RNA is generally detectable in upper respiratory specimens  during the acute phase of infection.  Positive results are indicative  of the presence of the identified virus, but do not rule out bacterial infection or co-infection with other pathogens not detected by the test.  Clinical correlation with patient history and  other diagnostic information is necessary to determine patient infection status.  The expected result is negative.  Fact Sheet for Patients:   02/08/20   Fact Sheet for Healthcare Providers:   BoilerBrush.com.cy    This test is not yet approved or cleared by the https://pope.com/ FDA and  has been authorized for detection and/or diagnosis of SARS-CoV-2 by FDA under an Emergency Use Authorization (EUA).  This EUA will remain in effect (meaning this te st can be used) for the duration of  the COVID-19 declaration  under Section 564(b)(1) of the Act, 21 U.S.C. section 360-bbb-3(b)(1), unless the authorization is terminated or revoked sooner.  Performed at [facility], [address] [place], [place]      Labs: Basic Metabolic Panel: Recent Labs  Lab 02/05/20 2014 02/07/20 0508 02/08/20 0503 02/09/20 0556 02/10/20 0732  NA 133* 139 141 143 141  K 3.6 3.8 3.4* 3.8 3.8  CL 99 103 106 107 104  CO2 22 26 25 25 25   GLUCOSE 118* 102* 103* 93 105*  BUN 16 25* 28* 31* 28*  CREATININE 0.86 0.90 0.97 0.96 0.91  CALCIUM 8.5* 8.5* 8.5* 8.8* 8.9   Liver Function Tests: Recent Labs  Lab 02/07/20 0508 02/08/20 0503 02/09/20 0556 02/10/20 0732  AST 44* 37 47* 48*  ALT 43 44 52* 67*  ALKPHOS 48 49 45 45  BILITOT 0.8 0.7 0.8 0.9  PROT 6.7 6.8 6.9 6.7  ALBUMIN 3.1* 3.3* 3.4* 3.3*   No results for input(s): LIPASE, AMYLASE in the last 168 hours. No results for input(s): AMMONIA in the last 168 hours. CBC: Recent Labs  Lab 02/05/20 2014 02/07/20 0508 02/08/20 0503 02/09/20 0556 02/10/20  0732  WBC 6.1 5.1 6.1 7.6 9.1  NEUTROABS  --  3.6 4.3 5.3 7.0  HGB 16.0 14.2 14.5 14.7 14.9  HCT 44.4 41.2 42.3 42.7 42.3  MCV 83.0 87.1 87.6 88.0 85.5  PLT 246 261 364 376 405*   Cardiac Enzymes: No results for input(s): CKTOTAL, CKMB, CKMBINDEX, TROPONINI in the last 168 hours. BNP: BNP (last 3 results) Recent Labs    04/03/19 1605 02/06/20 0607  BNP 20.0 34.0     CBG: Recent Labs  Lab 02/07/20 1245 02/07/20 1721 02/08/20 0834 02/08/20 1229 02/08/20 1629  GLUCAP 143* 153* 120* 155* 124*       Signed:  Meredeth Ide MD.  [facility] 02/10/2020, 12:41 PM

## 2020-02-14 ENCOUNTER — Encounter (INDEPENDENT_AMBULATORY_CARE_PROVIDER_SITE_OTHER): Payer: 59

## 2020-02-14 ENCOUNTER — Encounter (INDEPENDENT_AMBULATORY_CARE_PROVIDER_SITE_OTHER): Payer: 59 | Admitting: Nurse Practitioner

## 2020-02-18 MED FILL — Dexamethasone Sodium Phosphate Inj 10 MG/ML: INTRAMUSCULAR | Qty: 0.6 | Status: AC

## 2020-03-26 ENCOUNTER — Encounter (INDEPENDENT_AMBULATORY_CARE_PROVIDER_SITE_OTHER): Payer: 59 | Admitting: Nurse Practitioner

## 2020-03-26 ENCOUNTER — Encounter (INDEPENDENT_AMBULATORY_CARE_PROVIDER_SITE_OTHER): Payer: 59

## 2020-04-02 ENCOUNTER — Other Ambulatory Visit (INDEPENDENT_AMBULATORY_CARE_PROVIDER_SITE_OTHER): Payer: Self-pay | Admitting: Vascular Surgery

## 2020-04-02 DIAGNOSIS — M79606 Pain in leg, unspecified: Secondary | ICD-10-CM

## 2020-04-02 DIAGNOSIS — R609 Edema, unspecified: Secondary | ICD-10-CM

## 2020-04-03 ENCOUNTER — Encounter (INDEPENDENT_AMBULATORY_CARE_PROVIDER_SITE_OTHER): Payer: Self-pay | Admitting: Nurse Practitioner

## 2020-04-03 ENCOUNTER — Ambulatory Visit (INDEPENDENT_AMBULATORY_CARE_PROVIDER_SITE_OTHER): Payer: 59 | Admitting: Nurse Practitioner

## 2020-04-03 ENCOUNTER — Ambulatory Visit (INDEPENDENT_AMBULATORY_CARE_PROVIDER_SITE_OTHER): Payer: 59

## 2020-04-03 ENCOUNTER — Other Ambulatory Visit: Payer: Self-pay

## 2020-04-03 VITALS — BP 121/78 | HR 93 | Resp 16 | Ht 73.0 in | Wt 268.0 lb

## 2020-04-03 DIAGNOSIS — R609 Edema, unspecified: Secondary | ICD-10-CM

## 2020-04-03 DIAGNOSIS — M79606 Pain in leg, unspecified: Secondary | ICD-10-CM | POA: Diagnosis not present

## 2020-04-03 DIAGNOSIS — R600 Localized edema: Secondary | ICD-10-CM | POA: Diagnosis not present

## 2020-04-03 DIAGNOSIS — M7989 Other specified soft tissue disorders: Secondary | ICD-10-CM

## 2020-04-08 ENCOUNTER — Encounter (INDEPENDENT_AMBULATORY_CARE_PROVIDER_SITE_OTHER): Payer: Self-pay | Admitting: Nurse Practitioner

## 2020-04-08 NOTE — Progress Notes (Signed)
Subjective:    Patient ID: Brett Warner, male    DOB: December 16, 1970, 49 y.o.   MRN: [id number] Chief Complaint  Patient presents with  . New Patient (Initial Visit)    ref lam edema    Brett Warner is a 49 year old male that presents from his primary care physician with concern for lower extremity pain and swelling.  The patient notes the swelling has been ongoing for approximately 1 to 2 years.  The swelling was tolerable and fairly under control until recently.  He is also noted some recent ankle pain and discomfort.  He notes that the left tends to be a little bit worse than the right.  Recently the patient had Covid approximately 2 months ago.  During this time he was given IV Lasix and noticed a great decrease in his leg swelling.  In fact it is noted that the patient lost approximately 30 pounds during his week hospitalization, much of which was likely related to fluid.  The patient also notes that he has a strong family history of cardiovascular disease including heart failure.  The patient also notes that when he was younger he had either fluid around the heart or issues with his heart valve.  There is no leg he does not have any known cardiovascular issues however this is not been evaluated in some time.  He denies any fever, chills, nausea, vomiting or diarrhea.  Today noninvasive studies show an ABI 1.32 on the right and 1.26 on the left.  The patient has follow-up triphasic tibial artery waveforms bilaterally with good toe waveforms bilaterally.     Review of Systems  Respiratory: Positive for shortness of breath.   Cardiovascular: Positive for leg swelling.  All other systems reviewed and are negative.      Objective:   Physical Exam Vitals reviewed.  HENT:     Head: Normocephalic.  Cardiovascular:     Rate and Rhythm: Normal rate and regular rhythm.     Pulses: Normal pulses.  Pulmonary:     Effort: Pulmonary effort is normal.  Musculoskeletal:     Right lower leg:  2+ Pitting Edema present.     Left lower leg: 2+ Pitting Edema present.  Neurological:     Mental Status: He is alert and oriented to person, place, and time.  Psychiatric:        Mood and Affect: Mood normal.        Behavior: Behavior normal.        Thought Content: Thought content normal.        Judgment: Judgment normal.     BP 121/78 (BP Location: Right Arm)   Pulse 93   Resp 16   Ht 6\' 1"  (1.854 m)   Wt 268 lb (121.6 kg)   BMI 35.36 kg/m   Past Medical History:  Diagnosis Date  . COVID-19     Social History   Socioeconomic History  . Marital status: Married    Spouse name: Not on file  . Number of children: Not on file  . Years of education: Not on file  . Highest education level: Not on file  Occupational History  . Not on file  Tobacco Use  . Smoking status: Former  . Smokeless tobacco: Never Used  Vaping Use  . Vaping Use: Never used  Substance and Sexual Activity  . Alcohol use: Not Currently  . Drug use: Not Currently  . Sexual activity: Not on file  Other Topics Concern  .  Not on file  Social History Narrative  . Not on file   Social Determinants of Health   Financial Resource Strain:   . Difficulty of Paying Living Expenses: Not on file  Food Insecurity:   . Worried About Programme researcher, broadcasting/film/video in the Last Year: Not on file  . Ran Out of Food in the Last Year: Not on file  Transportation Needs:   . Lack of Transportation (Medical): Not on file  . Lack of Transportation (Non-Medical): Not on file  Physical Activity:   . Days of Exercise per Week: Not on file  . Minutes of Exercise per Session: Not on file  Stress:   . Feeling of Stress : Not on file  Social Connections:   . Frequency of Communication with Friends and Family: Not on file  . Frequency of Social Gatherings with Friends and Family: Not on file  . Attends Religious Services: Not on file  . Active Member of Clubs or Organizations: Not on file  . Attends Tax inspector Meetings: Not on file  . Marital Status: Not on file  Intimate Partner Violence:   . Fear of Current or Ex-Partner: Not on file  . Emotionally Abused: Not on file  . Physically Abused: Not on file  . Sexually Abused: Not on file    History reviewed. No pertinent surgical history.  Family History  Problem Relation Age of Onset  . Congestive Heart Failure Father   . Gout Father   . Heart disease Maternal Grandmother     No Known Allergies     Assessment & Plan:   1. Leg swelling Today the patient does not have any evidence of peripheral artery disease based on today's studies.  However we have not evaluated his venous status for possible causes of lower extremity edema.  Also concern is the patient's history of heart failure, as his father passed from this disease in addition to his other previous cardiovascular issues when he was younger.  In addition, the extensive response to Lasix when he was hospitalized, we will send the patient to cardiology to ensure that there is no cardiovascular cause for his lower extremity edema as well.  Otherwise we will have the patient return in 6 to 8 weeks with noninvasive studies. - Ambulatory referral to Cardiology    Current Outpatient Medications on File Prior to Visit  Medication Sig Dispense Refill  . furosemide (LASIX) 20 MG tablet Take 20 mg by mouth daily.    . propranolol ER (INDERAL LA) 80 MG 24 hr capsule Take 80 mg by mouth daily.    Marland Kitchen ascorbic acid (VITAMIN C) 1000 MG tablet Take 1 tablet (1,000 mg total) by mouth daily. (Patient not taking: Reported on 04/03/2020) 5 tablet 0  . cholecalciferol (VITAMIN D) 25 MCG tablet Take 1 tablet (1,000 Units total) by mouth daily. (Patient not taking: Reported on 04/03/2020) 5 tablet 0  . zinc sulfate 220 (50 Zn) MG capsule Take 1 capsule (220 mg total) by mouth daily. (Patient not taking: Reported on 04/03/2020) 5 capsule 0   No current facility-administered medications on file  prior to visit.    There are no Patient Instructions on file for this visit. No follow-ups on file.   Georgiana Spinner, NP

## 2020-05-07 ENCOUNTER — Ambulatory Visit: Payer: 59 | Admitting: Internal Medicine

## 2020-05-13 ENCOUNTER — Ambulatory Visit (INDEPENDENT_AMBULATORY_CARE_PROVIDER_SITE_OTHER): Payer: 59 | Admitting: Cardiovascular Disease

## 2020-05-13 ENCOUNTER — Encounter: Payer: Self-pay | Admitting: Cardiovascular Disease

## 2020-05-13 ENCOUNTER — Other Ambulatory Visit: Payer: Self-pay

## 2020-05-13 VITALS — BP 116/72 | HR 74 | Ht 73.0 in | Wt 274.8 lb

## 2020-05-13 DIAGNOSIS — R079 Chest pain, unspecified: Secondary | ICD-10-CM | POA: Diagnosis not present

## 2020-05-13 DIAGNOSIS — R600 Localized edema: Secondary | ICD-10-CM

## 2020-05-13 NOTE — Progress Notes (Signed)
Cardiology Office Note:    Date:  05/13/2020   ID:  Brett Warner, DOB 01-Nov-1970, MRN [id number]  PCP:  Practice, [facility]  [facility] Cardiologist:  Kristeen Miss, MD  [facility] Electrophysiologist:  None   Referring MD: Georgiana Spinner, NP   Chief Complaint  Patient presents with  . Leg Swelling    Nov. 23, 2021:    Brett Warner is a 49 y.o. male with a hx of leg swelling .  [name] by Sheppard Plumber, NP for further evaluation of his leg edema .   Was seen with wife , Misty Stanley   + dyspnea.  Does not exercise  Lack of energy Some CP with stress  Sometimes has pleuretic CP   Leg edema gets better overnight , then accumulates over the course of the    Still eats bacon, hot dogs,  Processed lunch meat  Works at [employer], stands on his feet all day        Past Medical History:  Diagnosis Date  . COVID-19     History reviewed. No pertinent surgical history.  Current Medications: Current Meds  Medication Sig  . furosemide (LASIX) 20 MG tablet Take 20 mg by mouth daily.  . Multiple Vitamins-Minerals (MENS MULTI VITAMIN & MINERAL PO) Take 1 tablet by mouth daily.  . propranolol ER (INDERAL LA) 80 MG 24 hr capsule Take 80 mg by mouth daily.  . [DISCONTINUED] ascorbic acid (VITAMIN C) 1000 MG tablet Take 1 tablet (1,000 mg total) by mouth daily.  . [DISCONTINUED] cholecalciferol (VITAMIN D) 25 MCG tablet Take 1 tablet (1,000 Units total) by mouth daily.  . [DISCONTINUED] zinc sulfate 220 (50 Zn) MG capsule Take 1 capsule (220 mg total) by mouth daily.     Allergies:   Patient has no known allergies.   Social History   Socioeconomic History  . Marital status: Married    Spouse name: Not on file  . Number of children: Not on file  . Years of education: Not on file  . Highest education level: Not on file  Occupational History  . Not on file  Tobacco Use  . Smoking status: Former Games developer  . Smokeless tobacco:  Never Used  Vaping Use  . Vaping Use: Never used  Substance and Sexual Activity  . Alcohol use: Not Currently  . Drug use: Not Currently  . Sexual activity: Not on file  Other Topics Concern  . Not on file  Social History Narrative  . Not on file   Social Determinants of Health   Financial Resource Strain:   . Difficulty of Paying Living Expenses: Not on file  Food Insecurity:   . Worried About Programme researcher, broadcasting/film/video in the Last Year: Not on file  . Ran Out of Food in the Last Year: Not on file  Transportation Needs:   . Lack of Transportation (Medical): Not on file  . Lack of Transportation (Non-Medical): Not on file  Physical Activity:   . Days of Exercise per Week: Not on file  . Minutes of Exercise per Session: Not on file  Stress:   . Feeling of Stress : Not on file  Social Connections:   . Frequency of Communication with Friends and Family: Not on file  . Frequency of Social Gatherings with Friends and Family: Not on file  . Attends Religious Services: Not on file  . Active Member of Clubs or Organizations: Not on file  .  Attends BankerClub or Organization Meetings: Not on file  . Marital Status: Not on file     Family History: The patient's family history includes Congestive Heart Failure in his father; Gout in his father; Heart disease in his maternal grandmother.  ROS:   Please see the history of present illness.     All other systems reviewed and are negative.  EKGs/Labs/Other Studies Reviewed:    The following studies were reviewed today:   EKG:  Aug. 23, 2021:   Sinus tachy.  At 125.  No ST or T wave changes.   Recent Labs: 02/06/2020: B Natriuretic Peptide 34.0 02/10/2020: ALT 67; BUN 28; Creatinine, Ser 0.91; Hemoglobin 14.9; Platelets 405; Potassium 3.8; Sodium 141  Recent Lipid Panel No results found for: CHOL, TRIG, HDL, CHOLHDL, VLDL, LDLCALC, LDLDIRECT   Risk Assessment/Calculations:       Physical Exam:    VS:  BP 116/72   Pulse 74   Ht 6\' 1"   (1.854 m)   Wt 274 lb 12.8 oz (124.6 kg)   SpO2 97%   BMI 36.26 kg/m     Wt Readings from Last 3 Encounters:  05/13/20 274 lb 12.8 oz (124.6 kg)  04/03/20 268 lb (121.6 kg)  02/05/20 253 lb (114.8 kg)     GEN: middle age male,  Moderately obese  HEENT: Normal NECK: No JVD; No carotid bruits LYMPHATICS: No lymphadenopathy CARDIAC: RRR, no murmurs, rubs, gallops RESPIRATORY:  Clear to auscultation without rales, wheezing or rhonchi  ABDOMEN:  Obese, soft , non tender  MUSCULOSKELETAL:  No edema; No deformity  SKIN: Warm and dry NEUROLOGIC:  Alert and oriented x 3 PSYCHIATRIC:  Normal affect   ASSESSMENT:    1. Leg edema   2. Chest pain, unspecified type    PLAN:    In order of problems listed above:  1. Leg edema: Suspect that the etiology of his leg edema is multifactorial.  He is carrying too much weight.  He also continues to eat quite a bit of salt and is very inactive.  We discussed weight reduction plan.  I discussed starting a regular exercise plan.  Also advised him to work on reducing the salt in his diet.  I would like to get an echocardiogram for further evaluation of his LV function and valvular function.  2.  Chest pain: He has a family history of coronary artery disease.  He has been having some chest pains for the past several years.  We will schedule him for a Lexiscan Myoview study.  I will see him in 3 to 4 months for follow-up office visit.    Shared Decision Making/Informed Consent    The risks [chest pain, shortness of breath, cardiac arrhythmias, dizziness, blood pressure fluctuations, myocardial infarction, stroke/transient ischemic attack, nausea, vomiting, allergic reaction, radiation exposure, metallic taste sensation and life-threatening complications (estimated to be 1 in 10,000)], benefits (risk stratification, diagnosing coronary artery disease, treatment guidance) and alternatives of a nuclear stress test were discussed in detail with Brett Warner and he agrees to proceed.       Medication Adjustments/Labs and Tests Ordered: Current medicines are reviewed at length with the patient today.  Concerns regarding medicines are outlined above.  Orders Placed This Encounter  Procedures  . MYOCARDIAL PERFUSION IMAGING  . ECHOCARDIOGRAM COMPLETE   No orders of the defined types were placed in this encounter.   Patient Instructions  Medication Instructions:  Your provider recommends that you continue on your current medications as directed.  Please refer to the Current Medication list given to you today.   *If you need a refill on your cardiac medications before your next appointment, please call your pharmacy*  Testing/Procedures: Your provider has requested that you have an echocardiogram. Echocardiography is a painless test that uses sound waves to create images of your heart. It provides your doctor with information about the size and shape of your heart and how well your heart's chambers and valves are working. This procedure takes approximately one hour. There are no restrictions for this procedure.   Your provider has requested that you have a lexiscan myoview. For further information please visit [email or web address] Please follow instruction sheet, as given.  Follow-Up: At [facility], you and your health needs are our priority.  As part of our continuing mission to provide you with exceptional heart care, we have created designated Provider Care Teams.  These Care Teams include your primary Cardiologist (physician) and Advanced Practice Providers (APPs -  Physician Assistants and Nurse Practitioners) who all work together to provide you with the care you need, when you need it. Your next appointment:   3 month(s) The format for your next appointment:   In Person Provider:   You may see Dr. Elease Hashimoto or one of the following Advanced Practice Providers on your designated Care Team:    Tereso Newcomer, PA-C  Vin Bhagat,  PA-C    Low-Sodium Eating Plan Sodium, which is an element that makes up salt, helps you maintain a healthy balance of fluids in your body. Too much sodium can increase your blood pressure and cause fluid and waste to be held in your body. Your health care provider or dietitian may recommend following this plan if you have high blood pressure (hypertension), kidney disease, liver disease, or heart failure. Eating less sodium can help lower your blood pressure, reduce swelling, and protect your heart, liver, and kidneys. What are tips for following this plan? General guidelines  Most people on this plan should limit their sodium intake to 1,500-2,000 mg (milligrams) of sodium each day. Reading food labels   The Nutrition Facts label lists the amount of sodium in one serving of the food. If you eat more than one serving, you must multiply the listed amount of sodium by the number of servings.  Choose foods with less than 140 mg of sodium per serving.  Avoid foods with 300 mg of sodium or more per serving. Shopping  Look for lower-sodium products, often labeled as "low-sodium" or "no salt added."  Always check the sodium content even if foods are labeled as "unsalted" or "no salt added".  Buy fresh foods. ? Avoid canned foods and premade or frozen meals. ? Avoid canned, cured, or processed meats  Buy breads that have less than 80 mg of sodium per slice. Cooking  Eat more home-cooked food and less restaurant, buffet, and fast food.  Avoid adding salt when cooking. Use salt-free seasonings or herbs instead of table salt or sea salt. Check with your health care provider or pharmacist before using salt substitutes.  Cook with plant-based oils, such as canola, sunflower, or olive oil. Meal planning  When eating at a restaurant, ask that your food be prepared with less salt or no salt, if possible.  Avoid foods that contain MSG (monosodium glutamate). MSG is sometimes added to [place]  food, bouillon, and some canned foods. What foods are recommended? The items listed may not be a complete list. Talk with your dietitian about what dietary choices  are best for you. Grains Low-sodium cereals, including oats, puffed wheat and rice, and shredded wheat. Low-sodium crackers. Unsalted rice. Unsalted pasta. Low-sodium bread. Whole-grain breads and whole-grain pasta. Vegetables Fresh or frozen vegetables. "No salt added" canned vegetables. "No salt added" tomato sauce and paste. Low-sodium or reduced-sodium tomato and vegetable juice. Fruits Fresh, frozen, or canned fruit. Fruit juice. Meats and other protein foods Fresh or frozen (no salt added) meat, poultry, seafood, and fish. Low-sodium canned tuna and salmon. Unsalted nuts. Dried peas, beans, and lentils without added salt. Unsalted canned beans. Eggs. Unsalted nut butters. Dairy Milk. Soy milk. Cheese that is naturally low in sodium, such as ricotta cheese, fresh mozzarella, or Swiss cheese Low-sodium or reduced-sodium cheese. Cream cheese. Yogurt. Fats and oils Unsalted butter. Unsalted margarine with no trans fat. Vegetable oils such as canola or olive oils. Seasonings and other foods Fresh and dried herbs and spices. Salt-free seasonings. Low-sodium mustard and ketchup. Sodium-free salad dressing. Sodium-free light mayonnaise. Fresh or refrigerated horseradish. Lemon juice. Vinegar. Homemade, reduced-sodium, or low-sodium soups. Unsalted popcorn and pretzels. Low-salt or salt-free chips. What foods are not recommended? The items listed may not be a complete list. Talk with your dietitian about what dietary choices are best for you. Grains Instant hot cereals. Bread stuffing, pancake, and biscuit mixes. Croutons. Seasoned rice or pasta mixes. Noodle soup cups. Boxed or frozen macaroni and cheese. Regular salted crackers. Self-rising flour. Vegetables Sauerkraut, pickled vegetables, and relishes. Olives. [place] fries. Onion  rings. Regular canned vegetables (not low-sodium or reduced-sodium). Regular canned tomato sauce and paste (not low-sodium or reduced-sodium). Regular tomato and vegetable juice (not low-sodium or reduced-sodium). Frozen vegetables in sauces. Meats and other protein foods Meat or fish that is salted, canned, smoked, spiced, or pickled. Bacon, ham, sausage, hotdogs, corned beef, chipped beef, packaged lunch meats, salt pork, jerky, pickled herring, anchovies, regular canned tuna, sardines, salted nuts. Dairy Processed cheese and cheese spreads. Cheese curds. Blue cheese. Feta cheese. String cheese. Regular cottage cheese. Buttermilk. Canned milk. Fats and oils Salted butter. Regular margarine. Ghee. Bacon fat. Seasonings and other foods Onion salt, garlic salt, seasoned salt, table salt, and sea salt. Canned and packaged gravies. Worcestershire sauce. Tartar sauce. Barbecue sauce. Teriyaki sauce. Soy sauce, including reduced-sodium. Steak sauce. Fish sauce. Oyster sauce. Cocktail sauce. Horseradish that you find on the shelf. Regular ketchup and mustard. Meat flavorings and tenderizers. Bouillon cubes. Hot sauce and Tabasco sauce. Premade or packaged marinades. Premade or packaged taco seasonings. Relishes. Regular salad dressings. Salsa. Potato and tortilla chips. Corn chips and puffs. Salted popcorn and pretzels. Canned or dried soups. Pizza. Frozen entrees and pot pies. Summary  Eating less sodium can help lower your blood pressure, reduce swelling, and protect your heart, liver, and kidneys.  Most people on this plan should limit their sodium intake to 1,500-2,000 mg (milligrams) of sodium each day.  Canned, boxed, and frozen foods are high in sodium. Restaurant foods, fast foods, and pizza are also very high in sodium. You also get sodium by adding salt to food.  Try to cook at home, eat more fresh fruits and vegetables, and eat less fast food, canned, processed, or prepared foods. This  information is not intended to replace advice given to you by your health care provider. Make sure you discuss any questions you have with your health care provider. Document Revised: 05/20/2017 Document Reviewed: 05/31/2016 Elsevier Patient Education  2020 [employer].     Signed, Kristeen Miss, MD  05/13/2020 6:00 PM  [facility]

## 2020-05-13 NOTE — Patient Instructions (Addendum)
Medication Instructions:  Your provider recommends that you continue on your current medications as directed. Please refer to the Current Medication list given to you today.   *If you need a refill on your cardiac medications before your next appointment, please call your pharmacy*  Testing/Procedures: Your provider has requested that you have an echocardiogram. Echocardiography is a painless test that uses sound waves to create images of your heart. It provides your doctor with information about the size and shape of your heart and how well your heart's chambers and valves are working. This procedure takes approximately one hour. There are no restrictions for this procedure.   Your provider has requested that you have a lexiscan myoview. For further information please visit [email or web address] Please follow instruction sheet, as given.  Follow-Up: At [facility], you and your health needs are our priority.  As part of our continuing mission to provide you with exceptional heart care, we have created designated Provider Care Teams.  These Care Teams include your primary Cardiologist (physician) and Advanced Practice Providers (APPs -  Physician Assistants and Nurse Practitioners) who all work together to provide you with the care you need, when you need it. Your next appointment:   3 month(s) The format for your next appointment:   In Person Provider:   You may see Dr. Elease Hashimoto or one of the following Advanced Practice Providers on your designated Care Team:    Tereso Newcomer, PA-C  Vin Bhagat, PA-C    Low-Sodium Eating Plan Sodium, which is an element that makes up salt, helps you maintain a healthy balance of fluids in your body. Too much sodium can increase your blood pressure and cause fluid and waste to be held in your body. Your health care provider or dietitian may recommend following this plan if you have high blood pressure (hypertension), kidney disease, liver disease, or heart  failure. Eating less sodium can help lower your blood pressure, reduce swelling, and protect your heart, liver, and kidneys. What are tips for following this plan? General guidelines  Most people on this plan should limit their sodium intake to 1,500-2,000 mg (milligrams) of sodium each day. Reading food labels   The Nutrition Facts label lists the amount of sodium in one serving of the food. If you eat more than one serving, you must multiply the listed amount of sodium by the number of servings.  Choose foods with less than 140 mg of sodium per serving.  Avoid foods with 300 mg of sodium or more per serving. Shopping  Look for lower-sodium products, often labeled as "low-sodium" or "no salt added."  Always check the sodium content even if foods are labeled as "unsalted" or "no salt added".  Buy fresh foods. ? Avoid canned foods and premade or frozen meals. ? Avoid canned, cured, or processed meats  Buy breads that have less than 80 mg of sodium per slice. Cooking  Eat more home-cooked food and less restaurant, buffet, and fast food.  Avoid adding salt when cooking. Use salt-free seasonings or herbs instead of table salt or sea salt. Check with your health care provider or pharmacist before using salt substitutes.  Cook with plant-based oils, such as canola, sunflower, or olive oil. Meal planning  When eating at a restaurant, ask that your food be prepared with less salt or no salt, if possible.  Avoid foods that contain MSG (monosodium glutamate). MSG is sometimes added to [place] food, bouillon, and some canned foods. What foods are recommended? The items listed  may not be a complete list. Talk with your dietitian about what dietary choices are best for you. Grains Low-sodium cereals, including oats, puffed wheat and rice, and shredded wheat. Low-sodium crackers. Unsalted rice. Unsalted pasta. Low-sodium bread. Whole-grain breads and whole-grain pasta. Vegetables Fresh or  frozen vegetables. "No salt added" canned vegetables. "No salt added" tomato sauce and paste. Low-sodium or reduced-sodium tomato and vegetable juice. Fruits Fresh, frozen, or canned fruit. Fruit juice. Meats and other protein foods Fresh or frozen (no salt added) meat, poultry, seafood, and fish. Low-sodium canned tuna and salmon. Unsalted nuts. Dried peas, beans, and lentils without added salt. Unsalted canned beans. Eggs. Unsalted nut butters. Dairy Milk. Soy milk. Cheese that is naturally low in sodium, such as ricotta cheese, fresh mozzarella, or Swiss cheese Low-sodium or reduced-sodium cheese. Cream cheese. Yogurt. Fats and oils Unsalted butter. Unsalted margarine with no trans fat. Vegetable oils such as canola or olive oils. Seasonings and other foods Fresh and dried herbs and spices. Salt-free seasonings. Low-sodium mustard and ketchup. Sodium-free salad dressing. Sodium-free light mayonnaise. Fresh or refrigerated horseradish. Lemon juice. Vinegar. Homemade, reduced-sodium, or low-sodium soups. Unsalted popcorn and pretzels. Low-salt or salt-free chips. What foods are not recommended? The items listed may not be a complete list. Talk with your dietitian about what dietary choices are best for you. Grains Instant hot cereals. Bread stuffing, pancake, and biscuit mixes. Croutons. Seasoned rice or pasta mixes. Noodle soup cups. Boxed or frozen macaroni and cheese. Regular salted crackers. Self-rising flour. Vegetables Sauerkraut, pickled vegetables, and relishes. Olives. [place] fries. Onion rings. Regular canned vegetables (not low-sodium or reduced-sodium). Regular canned tomato sauce and paste (not low-sodium or reduced-sodium). Regular tomato and vegetable juice (not low-sodium or reduced-sodium). Frozen vegetables in sauces. Meats and other protein foods Meat or fish that is salted, canned, smoked, spiced, or pickled. Bacon, ham, sausage, hotdogs, corned beef, chipped beef, packaged  lunch meats, salt pork, jerky, pickled herring, anchovies, regular canned tuna, sardines, salted nuts. Dairy Processed cheese and cheese spreads. Cheese curds. Blue cheese. Feta cheese. String cheese. Regular cottage cheese. Buttermilk. Canned milk. Fats and oils Salted butter. Regular margarine. Ghee. Bacon fat. Seasonings and other foods Onion salt, garlic salt, seasoned salt, table salt, and sea salt. Canned and packaged gravies. Worcestershire sauce. Tartar sauce. Barbecue sauce. Teriyaki sauce. Soy sauce, including reduced-sodium. Steak sauce. Fish sauce. Oyster sauce. Cocktail sauce. Horseradish that you find on the shelf. Regular ketchup and mustard. Meat flavorings and tenderizers. Bouillon cubes. Hot sauce and Tabasco sauce. Premade or packaged marinades. Premade or packaged taco seasonings. Relishes. Regular salad dressings. Salsa. Potato and tortilla chips. Corn chips and puffs. Salted popcorn and pretzels. Canned or dried soups. Pizza. Frozen entrees and pot pies. Summary  Eating less sodium can help lower your blood pressure, reduce swelling, and protect your heart, liver, and kidneys.  Most people on this plan should limit their sodium intake to 1,500-2,000 mg (milligrams) of sodium each day.  Canned, boxed, and frozen foods are high in sodium. Restaurant foods, fast foods, and pizza are also very high in sodium. You also get sodium by adding salt to food.  Try to cook at home, eat more fresh fruits and vegetables, and eat less fast food, canned, processed, or prepared foods. This information is not intended to replace advice given to you by your health care provider. Make sure you discuss any questions you have with your health care provider. Document Revised: 05/20/2017 Document Reviewed: 05/31/2016 Elsevier Patient Education  2020 [employer].

## 2020-05-20 ENCOUNTER — Other Ambulatory Visit (INDEPENDENT_AMBULATORY_CARE_PROVIDER_SITE_OTHER): Payer: Self-pay | Admitting: Nurse Practitioner

## 2020-05-20 DIAGNOSIS — M7989 Other specified soft tissue disorders: Secondary | ICD-10-CM

## 2020-05-22 ENCOUNTER — Encounter (INDEPENDENT_AMBULATORY_CARE_PROVIDER_SITE_OTHER): Payer: 59

## 2020-05-22 ENCOUNTER — Ambulatory Visit (INDEPENDENT_AMBULATORY_CARE_PROVIDER_SITE_OTHER): Payer: 59 | Admitting: Vascular Surgery

## 2020-06-04 ENCOUNTER — Telehealth: Payer: Self-pay

## 2020-06-04 NOTE — Telephone Encounter (Signed)
Attempted to contact the patient, with instructions for his stress test. Will try again later. My chart letter sent. S.Lulu Hirschmann EMTP

## 2020-06-10 ENCOUNTER — Ambulatory Visit (HOSPITAL_COMMUNITY): Payer: 59 | Attending: Cardiology

## 2020-06-10 ENCOUNTER — Other Ambulatory Visit: Payer: Self-pay

## 2020-06-10 ENCOUNTER — Ambulatory Visit (HOSPITAL_BASED_OUTPATIENT_CLINIC_OR_DEPARTMENT_OTHER): Payer: 59

## 2020-06-10 DIAGNOSIS — R079 Chest pain, unspecified: Secondary | ICD-10-CM | POA: Insufficient documentation

## 2020-06-10 DIAGNOSIS — R600 Localized edema: Secondary | ICD-10-CM | POA: Diagnosis not present

## 2020-06-10 LAB — ECHOCARDIOGRAM COMPLETE
Area-P 1/2: 4.6 cm2
Height: 72 in
S' Lateral: 3.5 cm
Weight: 4384 oz

## 2020-06-10 LAB — MYOCARDIAL PERFUSION IMAGING
LV dias vol: 114 mL (ref 62–150)
LV sys vol: 50 mL
Peak HR: 86 {beats}/min
Rest HR: 58 {beats}/min
SDS: 3
SRS: 0
SSS: 3
TID: 1.1

## 2020-06-10 MED ORDER — TECHNETIUM TC 99M TETROFOSMIN IV KIT
10.6000 | PACK | Freq: Once | INTRAVENOUS | Status: AC | PRN
  Administered 2020-06-10: 08:00:00 10.6 via INTRAVENOUS
  Filled 2020-06-10: qty 11

## 2020-06-10 MED ORDER — REGADENOSON 0.4 MG/5ML IV SOLN
0.4000 mg | Freq: Once | INTRAVENOUS | Status: AC
  Administered 2020-06-10: 09:00:00 0.4 mg via INTRAVENOUS

## 2020-06-10 MED ORDER — TECHNETIUM TC 99M TETROFOSMIN IV KIT
32.7000 | PACK | Freq: Once | INTRAVENOUS | Status: AC | PRN
  Administered 2020-06-10: 32.7 via INTRAVENOUS
  Filled 2020-06-10: qty 33

## 2020-06-11 ENCOUNTER — Telehealth: Payer: Self-pay | Admitting: Cardiovascular Disease

## 2020-06-11 NOTE — Telephone Encounter (Signed)
Wife called and wanted to know the results of her husband's Echocardiogram yesterday

## 2020-06-12 NOTE — Telephone Encounter (Signed)
[name], MD  06/12/2020 6:27 AM EST      Normal LV function Trace MR, trivial TR     [name], MD  06/12/2020 6:28 AM EST      Low risk myoview    Spoke with wife, Brett Warner (on [place]).  Pt requested she call for results.  Results of both echo and nuc study reviewed with her.  She will review with patient.  She had no further questions at the end of the call and thanked me for the information.

## 2020-07-08 IMAGING — US US EXTREM LOW VENOUS
1 series · 13 of 24 positions shown · non-contrast
Comparison: None.

CLINICAL DATA: Lower extremity edema bilaterally

EXAM:
BILATERAL LOWER EXTREMITY VENOUS DUPLEX ULTRASOUND
TECHNIQUE: Gray-scale sonography with graded compression, as well as color
Doppler and duplex ultrasound were performed to evaluate the lower
extremity deep venous systems from the level of the common femoral
vein and including the common femoral, femoral, profunda femoral,
popliteal and calf veins including the posterior tibial, peroneal
and gastrocnemius veins when visible. The superficial great
saphenous vein was also interrogated. Spectral Doppler was utilized
to evaluate flow at rest and with distal augmentation maneuvers in
the common femoral, femoral and popliteal veins.

[Series 1: us extrem low venous · 13 of 69 slices shown]
[im 1/69]
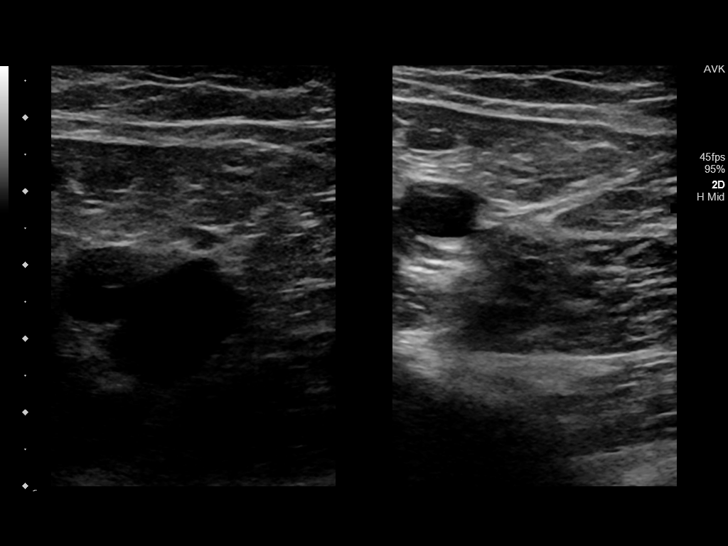
[im 6/69]
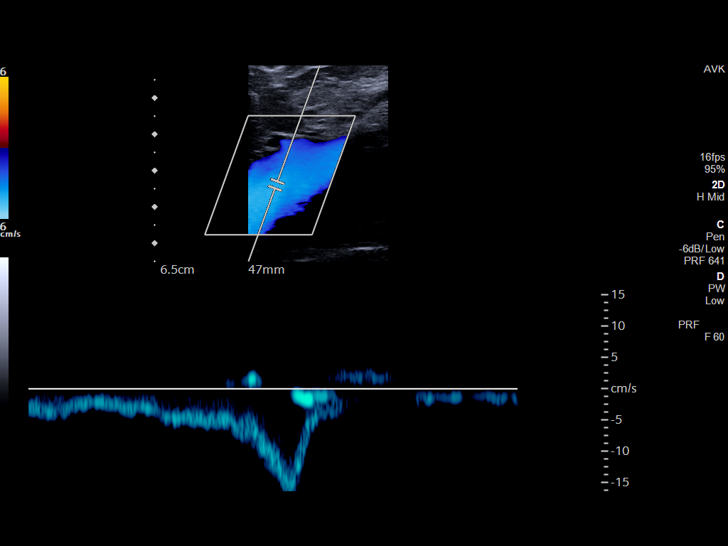
[im 12/69]
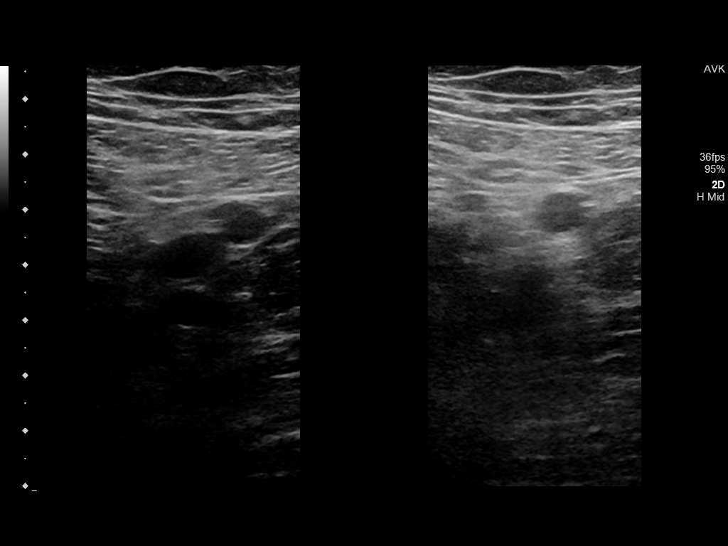
[im 18/69]
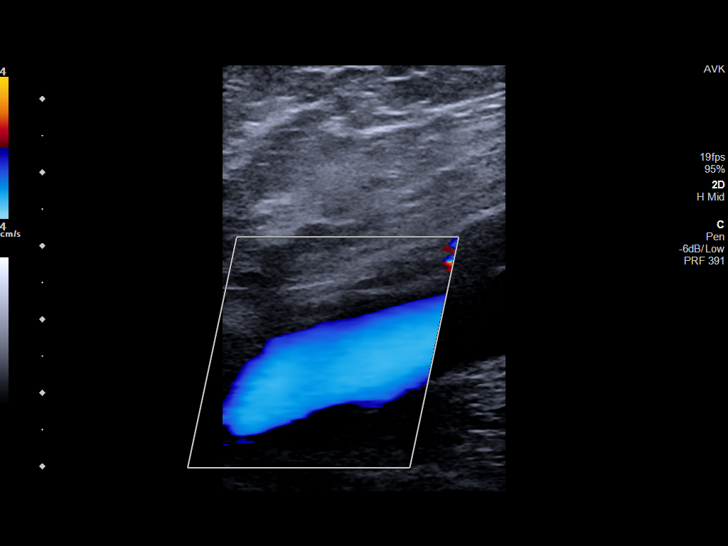
[im 24/69]
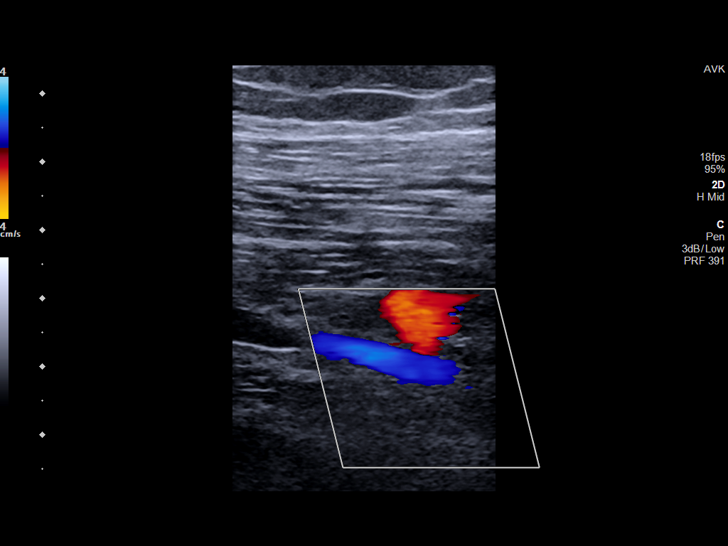
[im 30/69]
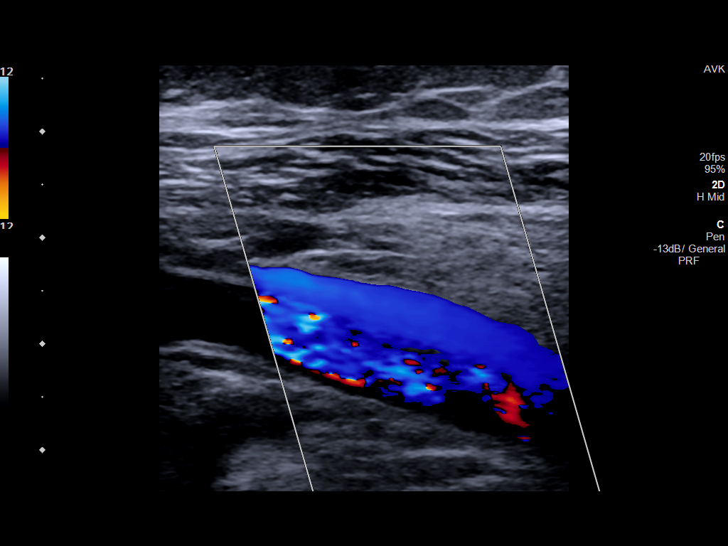
[im 36/69]
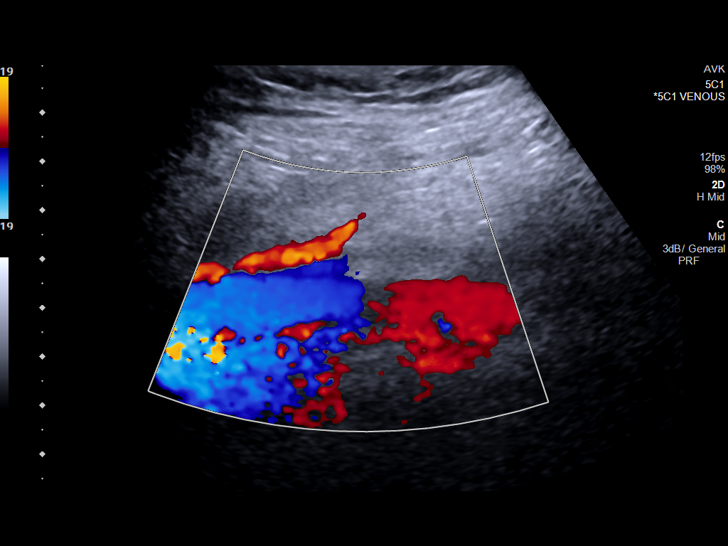
[im 39/69]
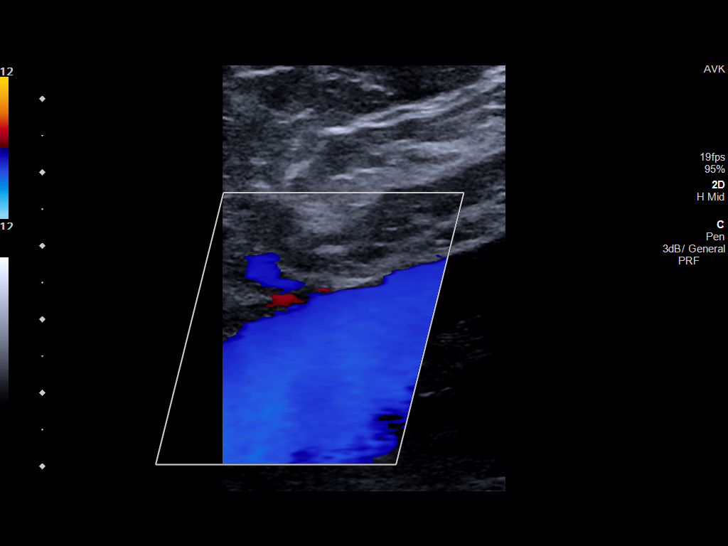
[im 45/69]
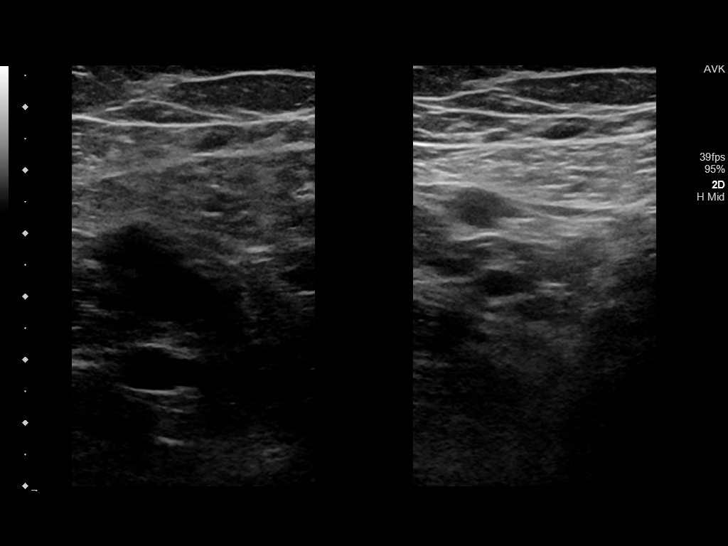
[im 51/69]
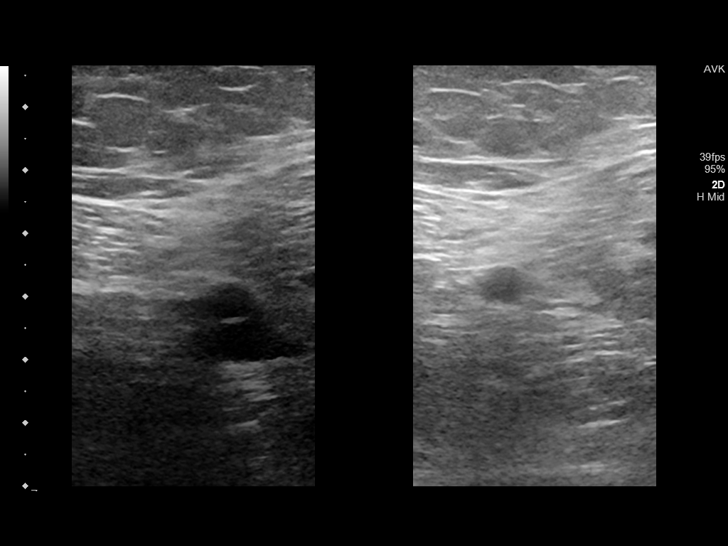
[im 57/69]
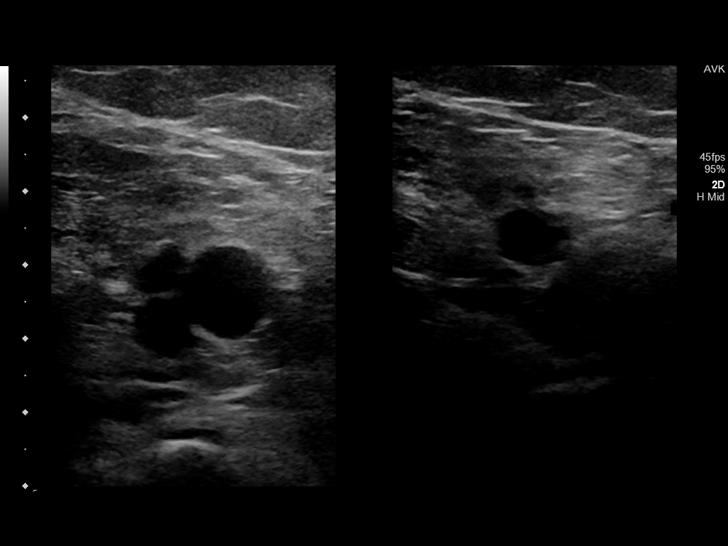
[im 63/69]
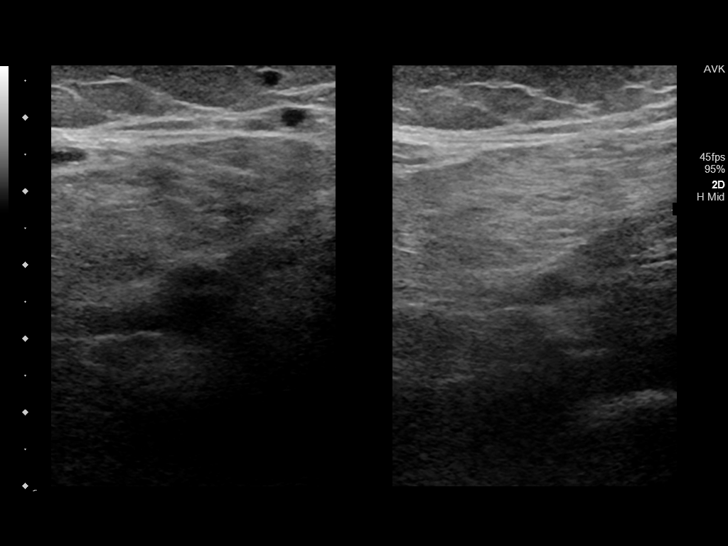
[im 69/69]
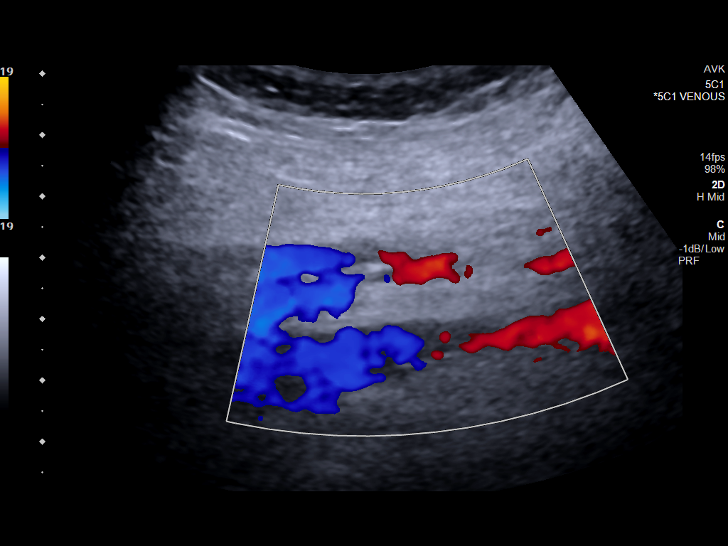

[13 of 24 positions shown; findings below may reference images not displayed]

FINDINGS: RIGHT LOWER EXTREMITY

Common Femoral Vein: No evidence of thrombus. Normal
compressibility, respiratory phasicity and response to augmentation.

Saphenofemoral Junction: No evidence of thrombus. Normal
compressibility and flow on color Doppler imaging.

Profunda Femoral Vein: No evidence of thrombus. Normal
compressibility and flow on color Doppler imaging.

Femoral Vein: No evidence of thrombus. Normal compressibility,
respiratory phasicity and response to augmentation.

Popliteal Vein: No evidence of thrombus. Normal compressibility,
respiratory phasicity and response to augmentation.

Calf Veins: No evidence of thrombus. Normal compressibility and flow
on color Doppler imaging.

Superficial Great Saphenous Vein: No evidence of thrombus. Normal
compressibility.

Venous Reflux:  None.

Other Findings:  None.

LEFT LOWER EXTREMITY

Common Femoral Vein: No evidence of thrombus. Normal
compressibility, respiratory phasicity and response to augmentation.

Saphenofemoral Junction: No evidence of thrombus. Normal
compressibility and flow on color Doppler imaging.

Profunda Femoral Vein: No evidence of thrombus. Normal
compressibility and flow on color Doppler imaging.

Femoral Vein: No evidence of thrombus. Normal compressibility,
respiratory phasicity and response to augmentation.

Popliteal Vein: No evidence of thrombus. Normal compressibility,
respiratory phasicity and response to augmentation.

Calf Veins: No evidence of thrombus. Normal compressibility and flow
on color Doppler imaging.

Superficial Great Saphenous Vein: No evidence of thrombus. Normal
compressibility.

Venous Reflux:  None.

Other Findings:  None.
IMPRESSION: No evidence of deep venous thrombosis in either lower extremity.

## 2020-08-18 ENCOUNTER — Ambulatory Visit: Payer: 59 | Admitting: Cardiovascular Disease

## 2020-10-09 NOTE — Telephone Encounter (Signed)
Close encounter 

## 2020-10-29 ENCOUNTER — Ambulatory Visit: Payer: 59 | Admitting: Orthopaedic Surgery

## 2020-10-30 ENCOUNTER — Ambulatory Visit (INDEPENDENT_AMBULATORY_CARE_PROVIDER_SITE_OTHER): Payer: 59 | Admitting: Orthopedic Surgery

## 2020-10-30 ENCOUNTER — Ambulatory Visit (INDEPENDENT_AMBULATORY_CARE_PROVIDER_SITE_OTHER): Payer: 59

## 2020-10-30 DIAGNOSIS — M25572 Pain in left ankle and joints of left foot: Secondary | ICD-10-CM

## 2020-10-30 DIAGNOSIS — M76822 Posterior tibial tendinitis, left leg: Secondary | ICD-10-CM

## 2020-10-30 DIAGNOSIS — I872 Venous insufficiency (chronic) (peripheral): Secondary | ICD-10-CM

## 2020-10-31 ENCOUNTER — Encounter: Payer: Self-pay | Admitting: Orthopedic Surgery

## 2020-10-31 NOTE — Progress Notes (Signed)
Office Visit Note   Patient: Brett Warner           Date of Birth: Jun 17, 1971           MRN: [id number] Visit Date: 10/30/2020              Requested by: Practice, [facility] [address] [place],  [place] [address] PCP: Lonie Peak, PA-C  Chief Complaint  Patient presents with  . Left Ankle - Pain      HPI: Patient is a 50 year old gentleman who presents with multiple problems.  Patient complains of posterior medial left ankle pain for a year he denies any specific injuries he states he has swelling and drainage from his leg he is on blood pressure medicine.  Patient denies a history of gout.  Patient states his father has the same swelling problems with his legs.  Assessment & Plan: Visit Diagnoses:  1. Pain in left ankle and joints of left foot   2. Posterior tibial tendinitis, left leg   3. Venous stasis dermatitis of left lower extremity     Plan: We will place patient in a posterior tibial tendon brace.  Recommended knee-high compression socks for the venous insufficiency.  Discussed the possibility of subtalar and talonavicular fusion.  Follow-Up Instructions: Return in about 4 weeks (around 11/27/2020).   Ortho Exam  Patient is alert, oriented, no adenopathy, well-dressed, normal affect, normal respiratory effort. Examination patient has dermatitis and weeping edema from the left leg secondary to venous insufficiency.  There is pitting edema.  The calf measures 44 cm in circumference.  Patient has a pronated valgus foot with pes planus.  He is unable to do a single limb heel raise he has a positive too many toes sign on the left.  Patient has essentially no subtalar motion.  This deformity appears chronic.  Imaging: No results found. No images are attached to the encounter.  Labs: Lab Results  Component Value Date   CRP 0.6 02/10/2020   CRP 0.6 02/09/2020   CRP 0.8 02/08/2020     Lab Results  Component Value Date   ALBUMIN 3.3 (L)  02/10/2020   ALBUMIN 3.4 (L) 02/09/2020   ALBUMIN 3.3 (L) 02/08/2020    No results found for: MG No results found for: VD25OH  No results found for: PREALBUMIN CBC EXTENDED Latest Ref Rng & Units 02/10/2020 02/09/2020 02/08/2020  WBC 4.0 - 10.5 K/uL 9.1 7.6 6.1  RBC 4.22 - 5.81 MIL/uL 4.95 4.85 4.83  HGB 13.0 - 17.0 g/dL 96.7 59.1 63.8  HCT 46.6 - 52.0 % 42.3 42.7 42.3  PLT 150 - 400 K/uL 405(H) 376 364  NEUTROABS 1.7 - 7.7 K/uL 7.0 5.3 4.3  LYMPHSABS 0.7 - 4.0 K/uL 1.3 1.5 1.1     There is no height or weight on file to calculate BMI.  Orders:  Orders Placed This Encounter  Procedures  . XR Ankle Complete Left   No orders of the defined types were placed in this encounter.    Procedures: No procedures performed  Clinical Data: No additional findings.  ROS:  All other systems negative, except as noted in the HPI. Review of Systems  Objective: Vital Signs: There were no vitals taken for this visit.  Specialty Comments:  No specialty comments available.  PMFS History: Patient Active Problem List   Diagnosis Date Noted  . Leg edema 05/13/2020  . Chest pain of uncertain etiology 05/13/2020  . Pneumonia due to COVID-19 virus 02/06/2020  Past Medical History:  Diagnosis Date  . COVID-19     Family History  Problem Relation Age of Onset  . Congestive Heart Failure Father   . Gout Father   . Heart disease Maternal Grandmother     History reviewed. No pertinent surgical history. Social History   Occupational History  . Not on file  Tobacco Use  . Smoking status: Former Games developer  . Smokeless tobacco: Never Used  Vaping Use  . Vaping Use: Never used  Substance and Sexual Activity  . Alcohol use: Not Currently  . Drug use: Not Currently  . Sexual activity: Not on file

## 2020-11-13 ENCOUNTER — Telehealth: Payer: Self-pay

## 2020-11-13 NOTE — Telephone Encounter (Signed)
pts wife called stating that when pt pumps up his boot it seeps out of the bubble and goes down.    She would like to know if that is normal ?

## 2020-11-14 NOTE — Telephone Encounter (Signed)
Called and lm on vm to advise that the PTTB that he was given in office most likely has a leak. They can bring the brace  but the small inflatable that is in the arch of the foot has developed a hole and air is escaping.

## 2020-11-27 ENCOUNTER — Encounter: Payer: Self-pay | Admitting: Orthopedic Surgery

## 2020-11-27 ENCOUNTER — Ambulatory Visit (INDEPENDENT_AMBULATORY_CARE_PROVIDER_SITE_OTHER): Payer: 59 | Admitting: Orthopedic Surgery

## 2020-11-27 DIAGNOSIS — I872 Venous insufficiency (chronic) (peripheral): Secondary | ICD-10-CM

## 2020-11-27 DIAGNOSIS — M25572 Pain in left ankle and joints of left foot: Secondary | ICD-10-CM

## 2020-11-27 DIAGNOSIS — M76822 Posterior tibial tendinitis, left leg: Secondary | ICD-10-CM

## 2020-11-27 NOTE — Progress Notes (Signed)
Office Visit Note   Patient: Brett Warner           Date of Birth: 04-01-1971           MRN: [id number] Visit Date: 11/27/2020              Requested by: Lonie Peak, PA-C [address] [place],  [place] [address] PCP: Lonie Peak, PA-C  Chief Complaint  Patient presents with   Left Ankle - Follow-up      HPI: Patient is a 50 year old gentleman who presents in follow-up for posterior tibial tendon insufficiency on the left he has worn a posterior tibial tendon brace he states that with working 9 hours a day on his foot the brace does not help his pain as the day progresses and has increasing pain over the lateral aspect of his foot as well as over the posterior tibial tendon.  Assessment & Plan: Visit Diagnoses:  1. Posterior tibial tendinitis, left leg   2. Venous stasis dermatitis of left lower extremity   3. Pain in left ankle and joints of left foot     Plan: Discussed with the patient the surgical option would be to proceed with fusion of the talonavicular and subtalar joint risk and benefits of surgery were discussed.  Discussed that he would be out of work for essentially 2 months with nonweightbearing for 1 month and then progressive weightbearing in a fracture boot for another month.  Patient states he would like to check on his short-term and long-term disability to see if he can proceed with surgery.  We will call to set this up at his convenience.  Follow-Up Instructions: Return if symptoms worsen or fail to improve.   Ortho Exam  Patient is alert, oriented, no adenopathy, well-dressed, normal affect, normal respiratory effort. Examination patient has a good dorsalis pedis pulse and good hair growth on the foot he does have venous swelling and varicose veins but no open ulcers.  Patient cannot do a single limb heel raise attempted single limb heel raise is painful his foot is in pronation and valgus with pes planus he has tenderness to palpation over the  posterior tibial tendon and has tenderness to palpation over the sinus Tarsi.  Imaging: No results found. No images are attached to the encounter.  Labs: Lab Results  Component Value Date   CRP 0.6 02/10/2020   CRP 0.6 02/09/2020   CRP 0.8 02/08/2020     Lab Results  Component Value Date   ALBUMIN 3.3 (L) 02/10/2020   ALBUMIN 3.4 (L) 02/09/2020   ALBUMIN 3.3 (L) 02/08/2020    No results found for: MG No results found for: VD25OH  No results found for: PREALBUMIN CBC EXTENDED Latest Ref Rng & Units 02/10/2020 02/09/2020 02/08/2020  WBC 4.0 - 10.5 K/uL 9.1 7.6 6.1  RBC 4.22 - 5.81 MIL/uL 4.95 4.85 4.83  HGB 13.0 - 17.0 g/dL 61.4 43.1 54.0  HCT 08.6 - 52.0 % 42.3 42.7 42.3  PLT 150 - 400 K/uL 405(H) 376 364  NEUTROABS 1.7 - 7.7 K/uL 7.0 5.3 4.3  LYMPHSABS 0.7 - 4.0 K/uL 1.3 1.5 1.1     There is no height or weight on file to calculate BMI.  Orders:  No orders of the defined types were placed in this encounter.  No orders of the defined types were placed in this encounter.    Procedures: No procedures performed  Clinical Data: No additional findings.  ROS:  All other systems negative, except  as noted in the HPI. Review of Systems  Objective: Vital Signs: There were no vitals taken for this visit.  Specialty Comments:  No specialty comments available.  PMFS History: Patient Active Problem List   Diagnosis Date Noted   Leg edema 05/13/2020   Chest pain of uncertain etiology 05/13/2020   Pneumonia due to COVID-19 virus 02/06/2020   Past Medical History:  Diagnosis Date   COVID-19     Family History  Problem Relation Age of Onset   Congestive Heart Failure Father    Gout Father    Heart disease Maternal Grandmother     History reviewed. No pertinent surgical history. Social History   Occupational History   Not on file  Tobacco Use   Smoking status: Former    Pack years: 0.00   Smokeless tobacco: Never  Vaping Use   Vaping Use: Never used   Substance and Sexual Activity   Alcohol use: Not Currently   Drug use: Not Currently   Sexual activity: Not on file

## 2020-12-19 ENCOUNTER — Telehealth: Payer: Self-pay | Admitting: Orthopedic Surgery

## 2020-12-19 NOTE — Telephone Encounter (Signed)
Mr. Fieldhouse still has questions regarding proposed surgery (left talonavicaular, subtalar fusion).  He wants to make sure that the surgery will fix the pain he is having in his ankle.  This is what is currently bothering him at present time.  He is walking different, so by the end of the day his ankle hurts.

## 2021-02-04 ENCOUNTER — Telehealth: Payer: Self-pay | Admitting: Orthopedic Surgery

## 2021-02-04 NOTE — Telephone Encounter (Signed)
Patient's wife called. She would like x-rays sent to [facility] [phone] is the fax. Her number is [phone]

## 2021-02-04 NOTE — Telephone Encounter (Signed)
This pt had an ankle xray in may and the pt's wife is requesting copy sent to [facility]. Is this something we can do or can we only burn a copy on disc and have pt pick up. Can you please advise once ready? Thanks!

## 2021-02-04 NOTE — Telephone Encounter (Signed)
Please see below. Thanks!

## 2021-02-04 NOTE — Telephone Encounter (Signed)
Okay. Thanks for letting me know!

## 2021-02-04 NOTE — Telephone Encounter (Signed)
Pt wife called back and states its fine. They do not need the X Rays anymore.

## 2021-02-04 NOTE — Telephone Encounter (Signed)
I can put on CD and mail to [facility] but cant fax them there. I will call the patient and ask which they would like to do.

## 2021-03-02 ENCOUNTER — Ambulatory Visit (INDEPENDENT_AMBULATORY_CARE_PROVIDER_SITE_OTHER): Payer: 59

## 2021-03-02 ENCOUNTER — Other Ambulatory Visit: Payer: Self-pay | Admitting: Podiatry

## 2021-03-02 ENCOUNTER — Other Ambulatory Visit: Payer: Self-pay

## 2021-03-02 ENCOUNTER — Ambulatory Visit (INDEPENDENT_AMBULATORY_CARE_PROVIDER_SITE_OTHER): Payer: 59 | Admitting: Podiatry

## 2021-03-02 DIAGNOSIS — M25372 Other instability, left ankle: Secondary | ICD-10-CM

## 2021-03-02 DIAGNOSIS — M76822 Posterior tibial tendinitis, left leg: Secondary | ICD-10-CM | POA: Diagnosis not present

## 2021-03-02 DIAGNOSIS — M2142 Flat foot [pes planus] (acquired), left foot: Secondary | ICD-10-CM | POA: Diagnosis not present

## 2021-03-02 MED ORDER — BETAMETHASONE SOD PHOS & ACET 6 (3-3) MG/ML IJ SUSP
6.0000 mg | Freq: Once | INTRAMUSCULAR | Status: AC
  Administered 2021-03-02: 6 mg

## 2021-03-02 NOTE — Progress Notes (Signed)
  Subjective:  Patient ID: Brett Warner, male    DOB: Dec 17, 1970,  MRN: [id number]  Chief Complaint  Patient presents with   Pain    Pt c/o Lt ankle pain at Lateral side x 1 1/2 wk - no injury - pt states he was seen by ortho and they did xr and wad told it was tendon issues pt was given a brace ( with no help) - -7/10 sharp pains - worse when stepping wron - pt states his ankles are turned inward w/ constant swelling tx: icing   50 y.o. male presents with the above complaint. History confirmed with patient.   Objective:  Physical Exam: warm, good capillary refill, no trophic changes or ulcerative lesions, normal DP and PT pulses, and normal sensory exam. Left Foot: POP PT tendon retromalleolar area. Pes planus with hindfoot collapse in stance. Unable to perform double or single heel rise.   Radiographs: X-ray of the left foot: pes planus,  fault, forefoot abduction. Assessment:   1. Posterior tibial tendonitis, left   2. Acquired pes planus, left    Plan:  Patient was evaluated and treated and all questions answered.  PT tendontiis, pes planus -X-rays reviewed as above -Dispensed Tri-Lock ankle brace.  Patient educated on use -Injection delivered to the PT Tendon  Procedure: Injection tendon Consent: Verbal consent obtained. Location: Left sinus tarsi. Skin Prep: alcohol. Injectate: 1 cc 0.5% marcaine plain, 1 cc betamethasone acetate-betamethasone sodium phosphate Disposition: Patient tolerated procedure well. Injection site dressed with a band-aid.  No follow-ups on file.

## 2021-03-09 ENCOUNTER — Other Ambulatory Visit: Payer: Self-pay | Admitting: Podiatry

## 2021-03-09 DIAGNOSIS — M76822 Posterior tibial tendinitis, left leg: Secondary | ICD-10-CM

## 2021-04-02 ENCOUNTER — Other Ambulatory Visit: Payer: Self-pay

## 2021-04-02 ENCOUNTER — Ambulatory Visit (INDEPENDENT_AMBULATORY_CARE_PROVIDER_SITE_OTHER): Payer: 59 | Admitting: Podiatry

## 2021-04-02 ENCOUNTER — Encounter: Payer: Self-pay | Admitting: Podiatry

## 2021-04-02 DIAGNOSIS — M76822 Posterior tibial tendinitis, left leg: Secondary | ICD-10-CM | POA: Diagnosis not present

## 2021-04-02 DIAGNOSIS — M2142 Flat foot [pes planus] (acquired), left foot: Secondary | ICD-10-CM

## 2021-04-06 NOTE — Progress Notes (Signed)
  Subjective:  Patient ID: Brett Warner, male    DOB: [birth year],  MRN: [id number]  Chief Complaint  Patient presents with   Foot Pain    My left leg is swelling and I wear the brace    50 y.o. male presents with the above complaint. History confirmed with patient.   Objective:  Physical Exam: warm, good capillary refill, no trophic changes or ulcerative lesions, normal DP and PT pulses, and normal sensory exam. Left Foot: POP PT tendon retromalleolar area. Pes planus with hindfoot collapse in stance. Unable to perform double or single heel rise.   Radiographs: X-ray of the left foot: pes planus, [place] fault, forefoot abduction. Assessment:   1. Posterior tibial tendonitis, left   2. Acquired pes planus, left    Plan:  Patient was evaluated and treated and all questions answered.  PT tendontiis, pes planus -Hold off repeat injection -Will immobilize in cam boot as brace is causing pain about the ankles  No follow-ups on file.

## 2021-04-30 ENCOUNTER — Ambulatory Visit (INDEPENDENT_AMBULATORY_CARE_PROVIDER_SITE_OTHER): Payer: 59 | Admitting: Podiatry

## 2021-04-30 ENCOUNTER — Other Ambulatory Visit: Payer: Self-pay

## 2021-04-30 ENCOUNTER — Encounter: Payer: Self-pay | Admitting: Podiatry

## 2021-04-30 DIAGNOSIS — M2142 Flat foot [pes planus] (acquired), left foot: Secondary | ICD-10-CM | POA: Diagnosis not present

## 2021-04-30 DIAGNOSIS — M76822 Posterior tibial tendinitis, left leg: Secondary | ICD-10-CM | POA: Diagnosis not present

## 2021-04-30 NOTE — Progress Notes (Signed)
  Subjective:  Patient ID: Brett Warner, male    DOB: [birth year],  MRN: [id number]  Chief Complaint  Patient presents with   Foot Pain    The boot helped the first week and then the 2nd week it started to hurt my back and I am feeling ok    49 y.o. male presents with the above complaint. History confirmed with patient. Still having a lot of pain at the ankle, states it is not much better than before, though the boot did originally help.  Objective:  Physical Exam: warm, good capillary refill, no trophic changes or ulcerative lesions, normal DP and PT pulses, and normal sensory exam. Left Foot: POP PT tendon retromalleolar area. Pes planus with hindfoot collapse in stance. Unable to perform double or single heel rise.   9/19 Radiographs: X-ray of the left foot: pes planus, [place] fault, forefoot abduction. Assessment:   1. Posterior tibial tendonitis, left   2. Acquired pes planus, left    Plan:  Patient was evaluated and treated and all questions answered.  PT tendontiis, pes planus -Still having pain, boot did help temporarily but is now causing more pain to the back. -He has failed conservative therapy of bracing, CAM boot, steroids without relief. He would benefit from MRI to evaluate the posterior tibial denton  Return in about 3 weeks (around 05/21/2021) for MRI review.

## 2021-05-12 IMAGING — CR DG CHEST 2V
1 series · 2 of 2 positions shown · non-contrast
Comparison: 04/03/2019

CLINICAL DATA: Short of breath, chest pain, [id number] positive 5
days ago

EXAM:
CHEST - 2 VIEW

[Series 1: dg chest 2 view · 0.14mm/px · 2 of 2 slices shown]
[im 1/2]
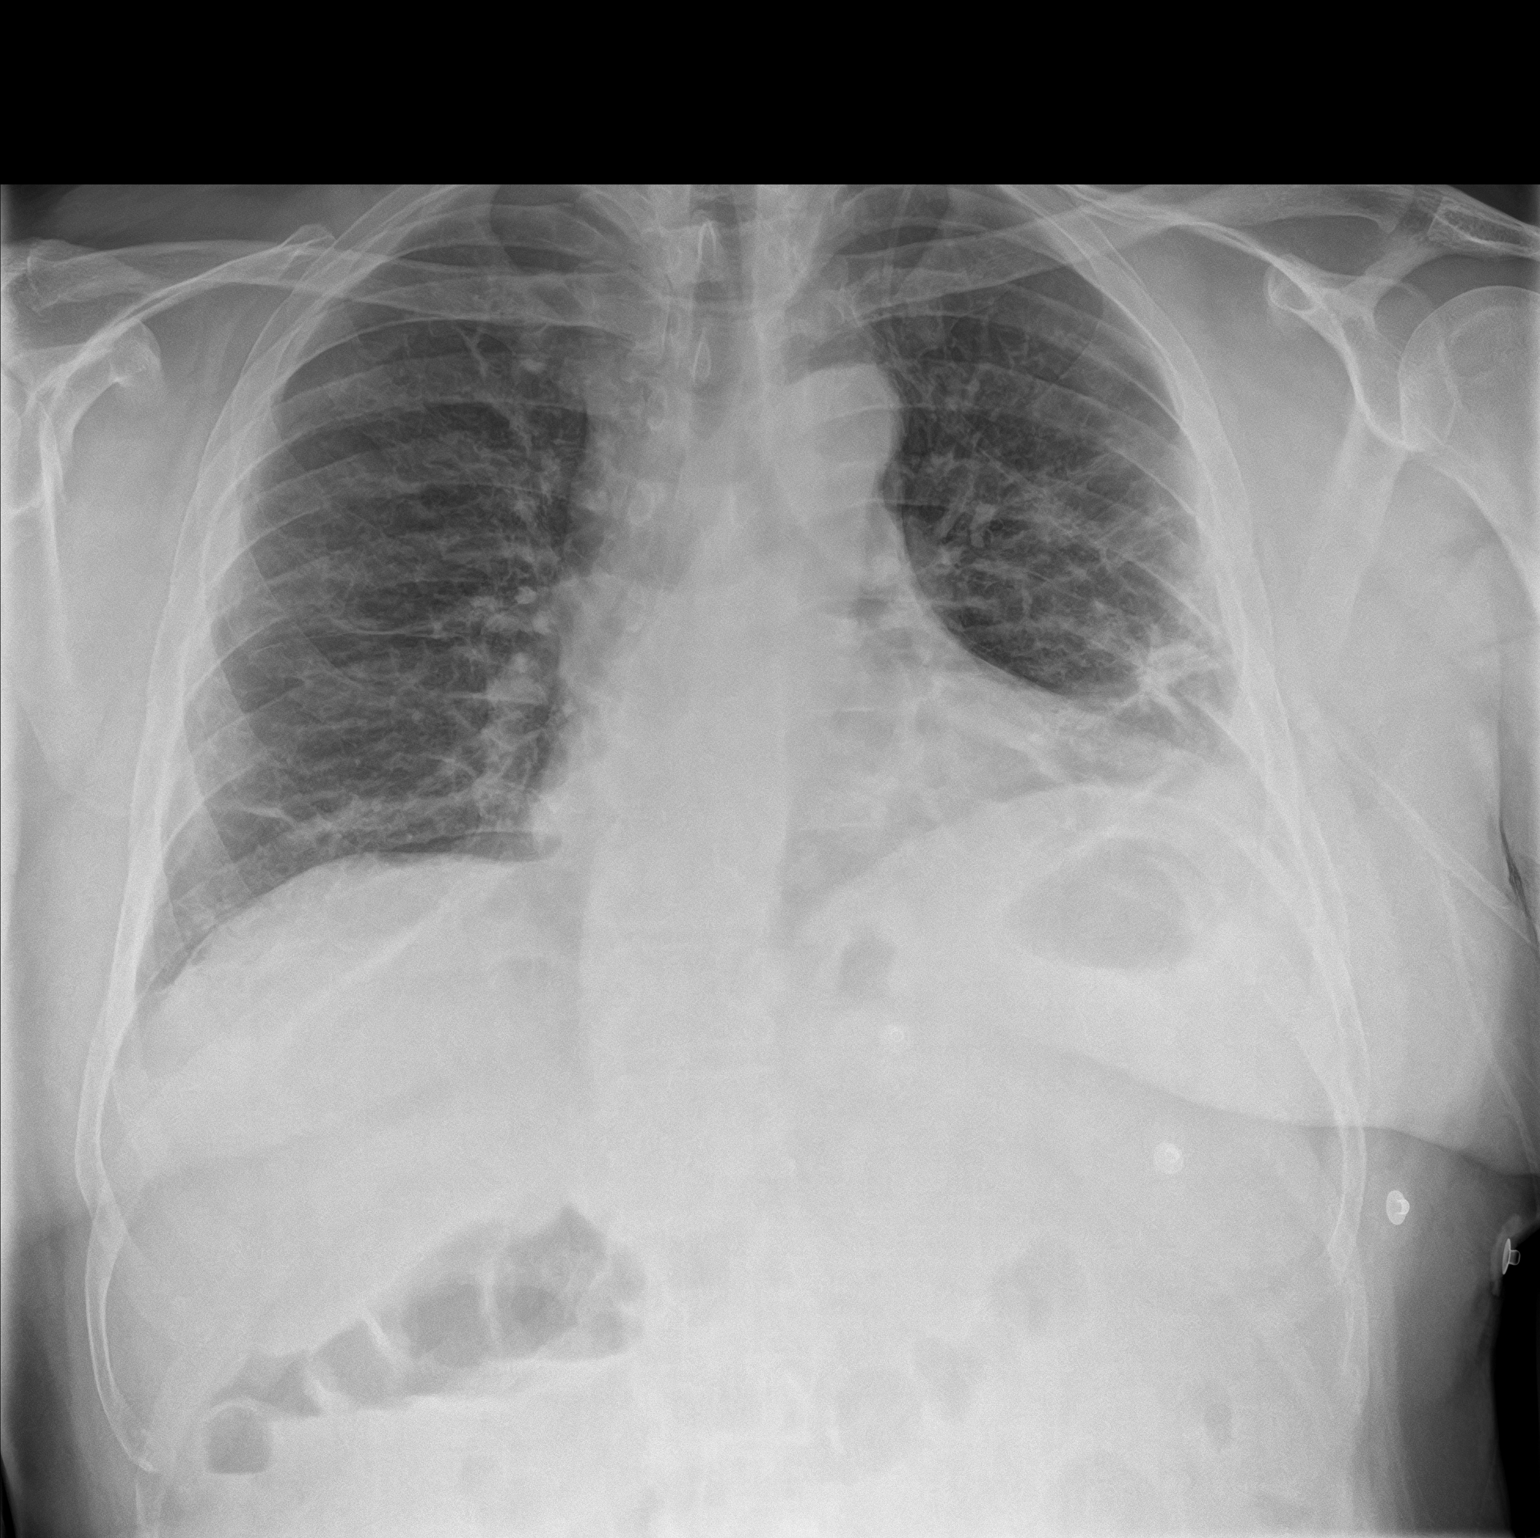
[im 2/2]
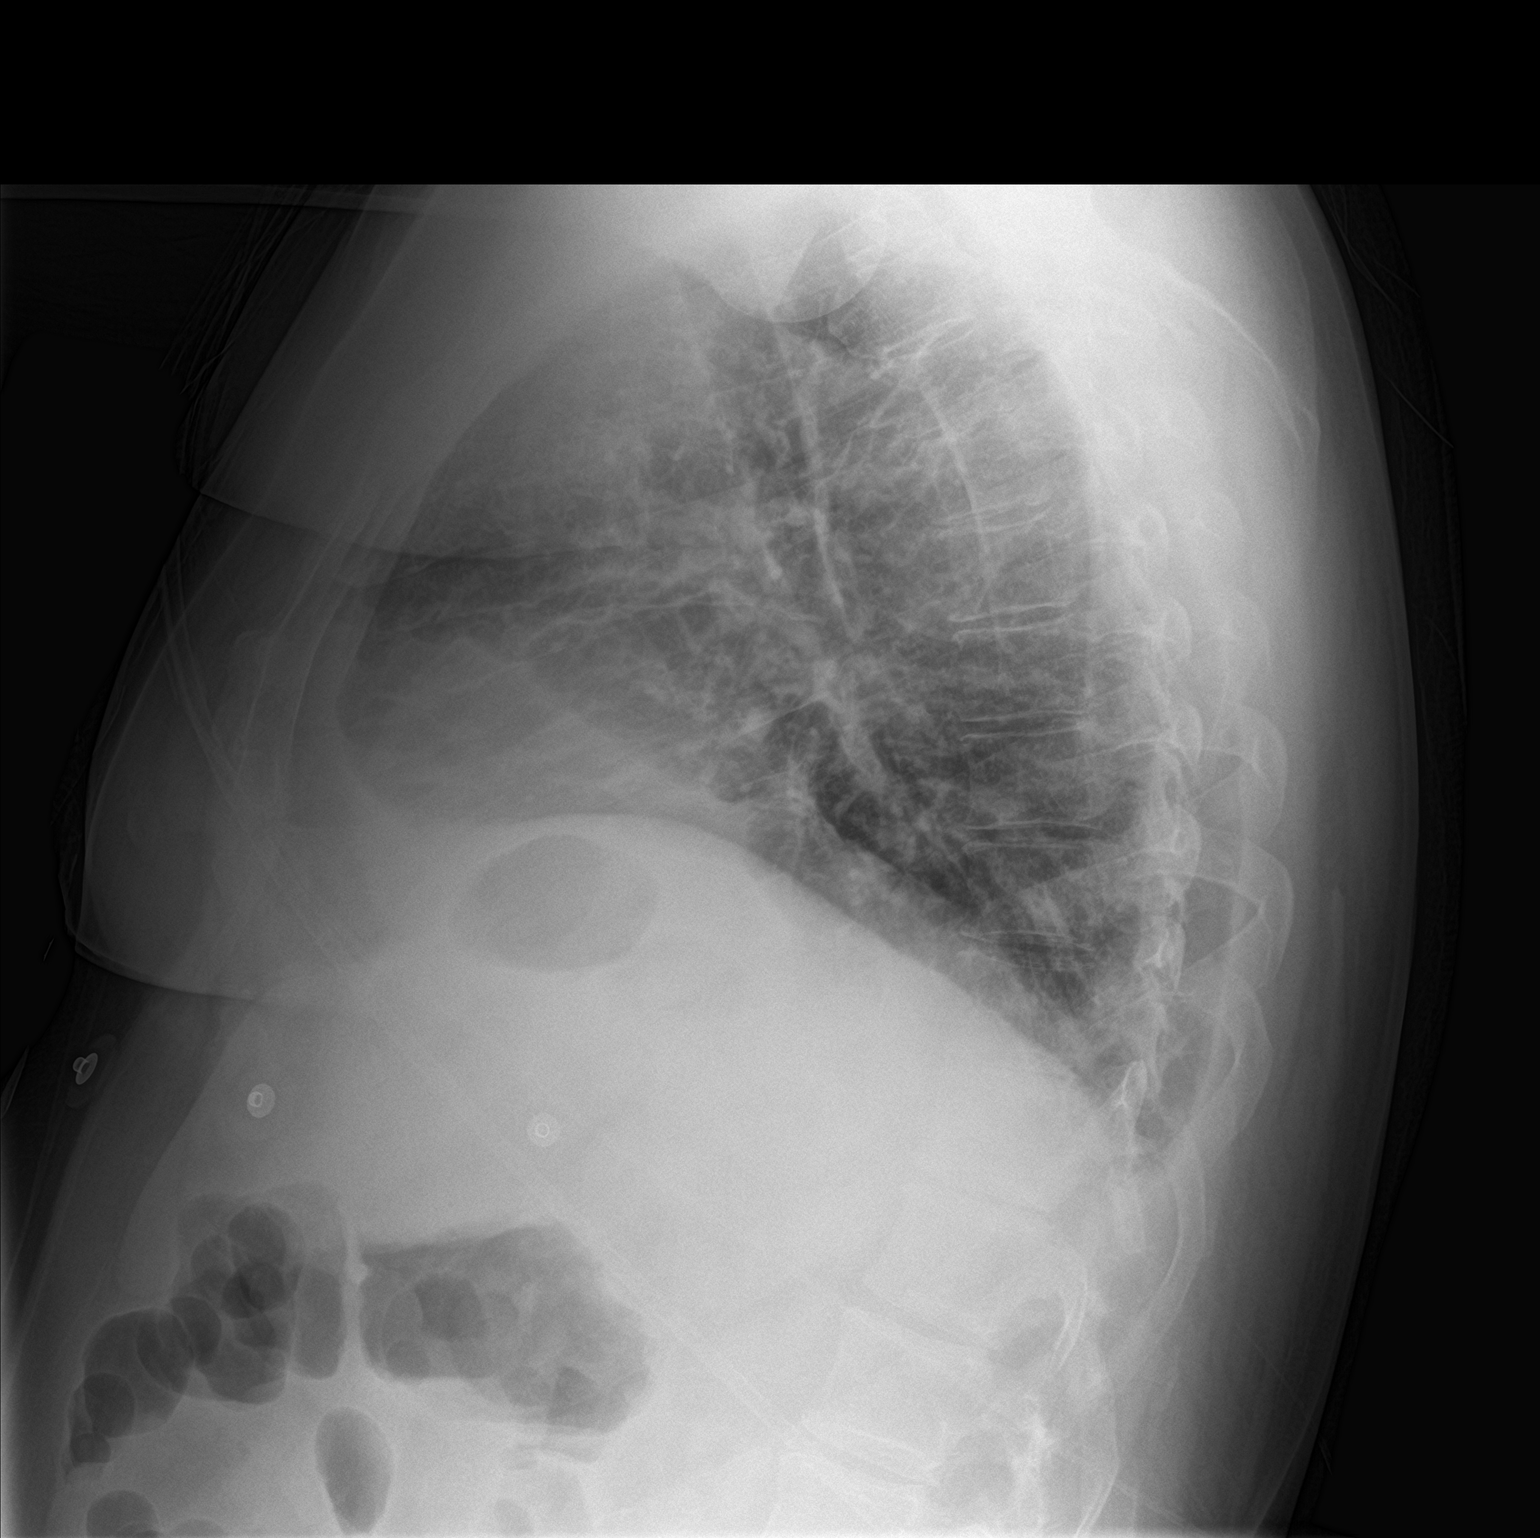

[2 of 2 positions shown; findings below may reference images not displayed]

FINDINGS: Frontal and lateral views of the chest demonstrate a stable cardiac
silhouette. There is increased interstitial prominence since prior
study, with patchy consolidation at the left lateral lung base. No
effusion or pneumothorax. No acute bony abnormalities.
IMPRESSION: 1. Diffuse increased interstitial prominence, with patchy left
basilar airspace disease. Findings consistent with bronchopneumonia.

## 2021-05-18 ENCOUNTER — Other Ambulatory Visit: Payer: 59

## 2021-05-21 ENCOUNTER — Ambulatory Visit: Payer: 59 | Admitting: Podiatry

## 2021-05-25 ENCOUNTER — Other Ambulatory Visit: Payer: 59

## 2022-03-30 DIAGNOSIS — R42 Dizziness and giddiness: Secondary | ICD-10-CM | POA: Diagnosis not present

## 2022-03-30 DIAGNOSIS — R7303 Prediabetes: Secondary | ICD-10-CM | POA: Diagnosis not present

## 2022-03-30 DIAGNOSIS — R7989 Other specified abnormal findings of blood chemistry: Secondary | ICD-10-CM | POA: Diagnosis not present

## 2022-03-30 DIAGNOSIS — Z125 Encounter for screening for malignant neoplasm of prostate: Secondary | ICD-10-CM | POA: Diagnosis not present

## 2022-03-30 DIAGNOSIS — R600 Localized edema: Secondary | ICD-10-CM | POA: Diagnosis not present

## 2022-04-14 DIAGNOSIS — L723 Sebaceous cyst: Secondary | ICD-10-CM | POA: Diagnosis not present

## 2022-07-22 DIAGNOSIS — F418 Other specified anxiety disorders: Secondary | ICD-10-CM | POA: Diagnosis not present

## 2022-08-18 DIAGNOSIS — Z6838 Body mass index (BMI) 38.0-38.9, adult: Secondary | ICD-10-CM | POA: Diagnosis not present

## 2022-08-18 DIAGNOSIS — Z1211 Encounter for screening for malignant neoplasm of colon: Secondary | ICD-10-CM | POA: Diagnosis not present

## 2022-08-18 DIAGNOSIS — F418 Other specified anxiety disorders: Secondary | ICD-10-CM | POA: Diagnosis not present

## 2023-04-04 DIAGNOSIS — R7989 Other specified abnormal findings of blood chemistry: Secondary | ICD-10-CM | POA: Diagnosis not present

## 2023-04-04 DIAGNOSIS — R7303 Prediabetes: Secondary | ICD-10-CM | POA: Diagnosis not present

## 2023-04-04 DIAGNOSIS — Z125 Encounter for screening for malignant neoplasm of prostate: Secondary | ICD-10-CM | POA: Diagnosis not present

## 2023-04-04 DIAGNOSIS — R600 Localized edema: Secondary | ICD-10-CM | POA: Diagnosis not present

## 2023-07-25 DIAGNOSIS — J342 Deviated nasal septum: Secondary | ICD-10-CM | POA: Diagnosis not present

## 2023-07-25 DIAGNOSIS — J019 Acute sinusitis, unspecified: Secondary | ICD-10-CM | POA: Diagnosis not present

## 2023-10-10 DIAGNOSIS — Z1331 Encounter for screening for depression: Secondary | ICD-10-CM | POA: Diagnosis not present

## 2023-10-10 DIAGNOSIS — R7989 Other specified abnormal findings of blood chemistry: Secondary | ICD-10-CM | POA: Diagnosis not present

## 2023-10-10 DIAGNOSIS — R7303 Prediabetes: Secondary | ICD-10-CM | POA: Diagnosis not present

## 2023-10-10 DIAGNOSIS — R600 Localized edema: Secondary | ICD-10-CM | POA: Diagnosis not present

## 2023-11-09 ENCOUNTER — Other Ambulatory Visit (HOSPITAL_COMMUNITY): Payer: Self-pay | Admitting: Family Medicine

## 2023-11-09 DIAGNOSIS — R0609 Other forms of dyspnea: Secondary | ICD-10-CM

## 2023-11-29 ENCOUNTER — Ambulatory Visit
